# Patient Record
Sex: Male | Born: 1960 | Race: White | Hispanic: No | Marital: Single | Smoking: Current every day smoker
Health system: Southern US, Community
[De-identification: ages and names within clinical notes are randomized; demographics above are authoritative.]

## PROBLEM LIST (undated history)

## (undated) DIAGNOSIS — I480 Paroxysmal atrial fibrillation: Secondary | ICD-10-CM

## (undated) DIAGNOSIS — Z720 Tobacco use: Secondary | ICD-10-CM

## (undated) DIAGNOSIS — I2510 Atherosclerotic heart disease of native coronary artery without angina pectoris: Secondary | ICD-10-CM

## (undated) DIAGNOSIS — E669 Obesity, unspecified: Secondary | ICD-10-CM

## (undated) DIAGNOSIS — L03116 Cellulitis of left lower limb: Secondary | ICD-10-CM

## (undated) DIAGNOSIS — F32A Depression, unspecified: Secondary | ICD-10-CM

## (undated) DIAGNOSIS — Z8719 Personal history of other diseases of the digestive system: Secondary | ICD-10-CM

## (undated) DIAGNOSIS — L02416 Cutaneous abscess of left lower limb: Secondary | ICD-10-CM

## (undated) DIAGNOSIS — I252 Old myocardial infarction: Secondary | ICD-10-CM

## (undated) DIAGNOSIS — I10 Essential (primary) hypertension: Secondary | ICD-10-CM

## (undated) DIAGNOSIS — M7989 Other specified soft tissue disorders: Secondary | ICD-10-CM

## (undated) DIAGNOSIS — F419 Anxiety disorder, unspecified: Secondary | ICD-10-CM

## (undated) DIAGNOSIS — G4733 Obstructive sleep apnea (adult) (pediatric): Secondary | ICD-10-CM

## (undated) DIAGNOSIS — E785 Hyperlipidemia, unspecified: Secondary | ICD-10-CM

## (undated) DIAGNOSIS — F329 Major depressive disorder, single episode, unspecified: Secondary | ICD-10-CM

## (undated) HISTORY — PX: ANKLE SURGERY: SHX546

## (undated) HISTORY — DX: Paroxysmal atrial fibrillation: I48.0

## (undated) HISTORY — DX: Tobacco use: Z72.0

## (undated) HISTORY — DX: Hyperlipidemia, unspecified: E78.5

## (undated) HISTORY — DX: Atherosclerotic heart disease of native coronary artery without angina pectoris: I25.10

## (undated) HISTORY — DX: Major depressive disorder, single episode, unspecified: F32.9

## (undated) HISTORY — DX: Obstructive sleep apnea (adult) (pediatric): G47.33

## (undated) HISTORY — DX: Personal history of other diseases of the digestive system: Z87.19

## (undated) HISTORY — DX: Essential (primary) hypertension: I10

## (undated) HISTORY — DX: Cellulitis of left lower limb: L03.116

## (undated) HISTORY — DX: Old myocardial infarction: I25.2

## (undated) HISTORY — DX: Depression, unspecified: F32.A

## (undated) HISTORY — DX: Other specified soft tissue disorders: M79.89

## (undated) HISTORY — DX: Cutaneous abscess of left lower limb: L02.416

## (undated) HISTORY — DX: Anxiety disorder, unspecified: F41.9

## (undated) HISTORY — DX: Obesity, unspecified: E66.9

---

## 2004-10-17 ENCOUNTER — Emergency Department: Payer: Self-pay | Admitting: Unknown Physician Specialty

## 2007-02-12 DIAGNOSIS — I2510 Atherosclerotic heart disease of native coronary artery without angina pectoris: Secondary | ICD-10-CM

## 2007-02-12 HISTORY — DX: Atherosclerotic heart disease of native coronary artery without angina pectoris: I25.10

## 2007-02-12 HISTORY — PX: CARDIAC CATHETERIZATION: SHX172

## 2007-11-04 ENCOUNTER — Other Ambulatory Visit: Payer: Self-pay

## 2007-11-05 ENCOUNTER — Inpatient Hospital Stay: Payer: Self-pay | Admitting: Internal Medicine

## 2007-11-05 ENCOUNTER — Ambulatory Visit: Payer: Self-pay | Admitting: Cardiology

## 2007-11-18 ENCOUNTER — Ambulatory Visit: Payer: Self-pay | Admitting: Cardiovascular Disease

## 2008-05-23 ENCOUNTER — Ambulatory Visit: Payer: Self-pay | Admitting: Internal Medicine

## 2008-05-23 ENCOUNTER — Encounter: Payer: Self-pay | Admitting: Cardiovascular Disease

## 2008-05-23 LAB — CONVERTED CEMR LAB
Total CHOL/HDL Ratio: 6.5
Total CK: 88 units/L (ref 7–232)
VLDL: 33 mg/dL (ref 0–40)

## 2008-05-31 ENCOUNTER — Ambulatory Visit: Payer: Self-pay | Admitting: Cardiovascular Disease

## 2008-07-05 ENCOUNTER — Telehealth: Payer: Self-pay | Admitting: Cardiovascular Disease

## 2008-07-06 ENCOUNTER — Observation Stay: Payer: Self-pay | Admitting: Internal Medicine

## 2008-07-06 ENCOUNTER — Ambulatory Visit: Payer: Self-pay | Admitting: Internal Medicine

## 2008-07-08 ENCOUNTER — Telehealth: Payer: Self-pay | Admitting: Cardiovascular Disease

## 2009-01-29 ENCOUNTER — Emergency Department: Payer: Self-pay | Admitting: Emergency Medicine

## 2010-06-26 NOTE — Assessment & Plan Note (Signed)
[facility] OFFICE NOTE   Jeffrey Alvarado, Jeffrey Alvarado                   MRN:          [id number]  DATE:11/18/2007                            DOB:          [birth year]    HISTORY OF PRESENT ILLNESS:  Jeffrey Alvarado is a pleasant 50 year old  Caucasian male with a past medical history significant for hypertension,  depression, tobacco, abuse, positive HIV by ELISA who was recently  admitted to the [facility] with complaints of  chest pain and was found to have a non-ST-elevation myocardial  infarction.  I performed a left heart catheterization on the patient on  November 06, 2007 and found that he had a 95% lesion in the proximal  right coronary artery.  There was nonobstructive disease in the LAD as  well as the circumflex.  I elected to perform a percutaneous coronary  intervention that day and placed a Vision bare-metal stent in the  proximal right coronary artery.  The patient was discharged to home on  aspirin, Plavix, beta-blocker, and a statin.  He has done well since his  discharge from the hospital on November 07, 2007.  He tells me that he  has had no recurrence of his chest pain, shortness of breath, or  palpitations.  He also denies any dizziness, near syncope, syncope,  diaphoresis, nausea, vomiting, orthopnea, PND, or lower extremity edema.  He states that his breathing is actually much better now than it has  been in several years.  His only complaint today is that he has a  swollen left jaw, which he feels is secondary to an infected tooth that  broke off recently.  He has not seen a dentist for this problem and  states it only started hurting this morning around 4:00 a.m.  He denies  any fevers or chills.   PAST MEDICAL HISTORY:  1. Coronary artery disease status post left heart catheterization on      November 06, 2007, which showed a 20% stenosis in the mid LAD, 25%  stenosis in the mid circumflex, 20% stenosis in the first obtuse      marginal,  95% stenosis in the proximal right coronary artery, 30%      stenosis in the mid right coronary artery, and 40% stenosis in the      distal right coronary artery.  The 95% stenosis in the right      coronary artery was addressed that day with placement of a Vision      bare-metal stent.  2. Hypertension.  3. Depression and panic attacks.  4. Tobacco abuse.  5. HIV positive by ELISA.   PAST SURGICAL HISTORY:  1. Ankle surgery secondary to a fracture from a trauma.   ALLERGIES:  CODEINE.   CURRENT MEDICATIONS:  1. Zoloft 50 mg once daily.  2. Aspirin 325 mg once daily.  3. Plavix 75 mg once daily.  4. Metoprolol 25 mg twice daily.  5. Lipitor 40 mg once daily.  6. Hydrochlorothiazide 25 mg once daily.   SOCIAL HISTORY:  He has smoked 1-2 packs per day for  the last 20 years.  He currently is smoking a half-pack of cigarettes per day.  He only  occasionally uses alcohol.  He denies use of illicit drugs.  He is  unemployed.   FAMILY HISTORY:  The patient's grandmother had atrial fibrillation and  stroke.  There is no family history of premature coronary artery  disease.  His parents are healthy.   REVIEW OF SYSTEMS:  As stated in the history of present illness, and is  otherwise negative.   PHYSICAL EXAMINATION:  GENERAL:  He is a pleasant young Caucasian male  in no acute distress.  VITAL SIGNS:  Blood pressure 120/78, pulse 81 and regular, and  respirations 12 and unlabored.  Weight 235 pounds.  NECK:  No JVD.  No carotid bruits.  No thyromegaly.  No lymphadenopathy.  SKIN:  Warm and dry.  HEENT:  Oropharynx clear.  Mucous membranes are moist.  I am unable to  visualize the broken tooth.  I did palpate the patient's jaw and there  is local swelling underneath his left mandible as well as over the  surface of his left cheek.  The patient has full range of motion of his  jaw at the current  time.  LUNGS:  Clear to auscultation bilaterally without wheezes, rhonchi, or  crackles.  CARDIOVASCULAR:  Regular rate and rhythm without murmurs, gallops, or  rubs noted.  ABDOMEN:  Soft, nontender.  Bowel sounds are present.  EXTREMITIES:  No evidence of edema.  Pulses are 2+ in all extremities.   DIAGNOSTIC STUDIES:  1. A 12-lead electrocardiogram obtained in our office today shows      normal sinus rhythm with a ventricular rate of 81 beats per minute.      There are no ischemic changes noted.  2. Left heart catheterization as outlined above performed on November 06, 2007.  As documented, Vision bare-mental stent was placed in      the 95% stenosis in the proximal right coronary artery.   ASSESSMENT AND PLAN:  This is a pleasant 50 year old Caucasian male with  coronary artery disease, hypertension, tobacco abuse and human  immunodeficiency syndrome who presents to our hospital followup after  placement of a bare-metal stent in his proximal right coronary artery.  From a cardiovascular standpoint, the patient is doing very well.  I  would like to continue his aspirin and Plavix for at least 4 weeks if  possible.  He has made arrangements through his primary care office to  pay for all of his medications.  I am somewhat concerned about his left  facial swelling, which is most likely secondary to an inflamed or  infected molar.  I have encouraged him to get in and see a dentist some  time in the next day or 2.  The patient has been taking what sounds like  amoxicillin at home, which he had from a prior dental infection.  I am  most concerned about him being continued on his aspirin and Plavix.  I  have told him that if his dentist would like to pull his tooth that I  would not like for the Plavix to be stopped.  I would at least like to  receive a phone call from the patient or his dentist prior to tooth  extraction.  It would be best if we could wait another 2 weeks to   perform any tooth extraction, because of the patient's Plavix.  With a  bare-metal stent, we  would like to have at least 4 weeks of total Plavix  coverage prior to discontinuing this therapy.  In regards to his other  medications, I would like to continue the metoprolol and Lipitor as  written.  I will see him back in our office here in 6 months and will  perform a fasting lipid profile that day of his visit in this office.     Verne Carrow, MD  Electronically Signed    CM/MedQ  DD: 11/18/2007  DT: 11/19/2007  Job #: [id number]   cc:   Pilar Grammes

## 2010-06-26 NOTE — Assessment & Plan Note (Signed)
[facility] OFFICE NOTE   HAGER, COMPSTON                   MRN:          [id number]  DATE:05/31/2008                            DOB:          1960-06-17    PRIMARY CARE PHYSICIAN:  Dr. Brain Hilts at [facility].   HISTORY OF PRESENT ILLNESS:  Jeffrey Alvarado is a pleasant 50 year old  Caucasian male with a past medical history significant for hypertension,  coronary artery disease, hyperlipidemia, depression and tobacco abuse,  who was followed in our office for his coronary artery disease and  returns today for routine cardiac followup.  He had a non-ST-elevation  myocardial infarction in September 2009 and was found during diagnostic  left heart catheterization to have a 95% lesion in the proximal right  coronary artery.  There was nonobstructive disease noted in the LAD, as  well as the circumflex coronary artery.  I performed a percutaneous  coronary intervention with placement of a Vision bare-metal stent in the  proximal right coronary artery at that time.  He has been seen once  since his hospital discharge in our office in October 2009.  He returns  today and tells me that he has been doing well.  It was documented while  he was in the hospital that he had a positive ELISA for HIV.  Since  then, his primary care physician has rechecked his HIV status and has  told him that it is negative.  He has no cardiac complaints at the  current time.  He denies any exertional chest pain, exertional dyspnea,  palpitations, dizziness, near-syncope, syncope, diaphoresis, orthopnea,  PND, or lower extremity edema.  He has been taking his aspirin, Plavix  beta-blocker, and hydrochlorothiazide as prescribed.  He has  unfortunately not been taking a statin medication.  We had him come in  for a fasting lipid profile in our office on May 23, 2008, that showed  his total cholesterol of 240, triglycerides  163, HDL cholesterol 37, and  LDL cholesterol was 170.  These values were obtained when he was not  taking his Lipitor as prescribed.  I asked him to start taking  pravastatin 80 mg once daily, which is on the $4 list at [facility].  He  started taking that medication last week.  He unfortunately has gained  36 pounds since his last visit in our office 6 months ago.  He does  continue to smoke cigarettes.   PAST MEDICAL HISTORY:  1. Coronary artery disease status post placement of a Vision bare-      metal stent in the proximal right coronary artery in September      2009.  Mild nonobstructive disease in the LAD with a 20% stenosis      in the midportion of LAD, 25% stenosis in the mid circumflex, and      20% stenosis in the mid right coronary artery with a 40% stenosis      in the distal right coronary artery.  2. Hypertension.  3. Hyperlipidemia.  4. Depression and panic attacks.  5. Tobacco abuse.  6. Unknown  HIV status.  The patient tested positive while in the      hospital, but has, per his report, tested negative in his primary      care office.   ALLERGIES:  CODEINE.   CURRENT MEDICATIONS:  1. Zoloft 50 mg once daily.  2. Aspirin 325 mg once daily.  3. Plavix 75 mg once daily.  4. Metoprolol 25 mg twice daily.  5. Lipitor.  6. Hydrochlorothiazide 25 mg once daily.  7. Pravastatin 80 mg once daily.   PHYSICAL EXAMINATION:  VITAL SIGNS:  Blood pressure 114/80, pulse 59 and  regular, and respirations 12 and unlabored.  GENERAL:  He is a pleasant young Caucasian male in no acute distress.  He is alert and oriented x3.  HEENT:  Normal.  SKIN:  Warm and dry.  NECK:  No JVD.  No carotid bruits.  No thyromegaly.  No lymphadenopathy.  LUNGS:  Clear to auscultation bilaterally without wheezes, rhonchi, or  crackles noted.  CARDIOVASCULAR:  Bradycardia with normal first and second heart sounds.  No murmurs, gallops, or rubs are noted.  ABDOMEN:  Obese, soft, bowel  sounds are  present.  EXTREMITIES:  No evidence of edema.  Pulses are 2+ in all extremities.  PSYCHIATRIC:  Mood and affect are appropriate.  MUSCULOSKELETAL:  Muscle strength is normal in all extremities.   DIAGNOSTIC STUDIES:  1. A 12-lead EKG obtained in our office today shows sinus bradycardia      with a ventricular rate of 59 beats per minute.  This is otherwise      a normal EKG.  2. Recent fasting lipid profile from May 23, 2008, shows total      cholesterol of 240, triglycerides 163, HDL cholesterol 37, LDL      cholesterol 170, and CK 88.   ASSESSMENT AND PLAN:  This is a pleasant 50 year old Caucasian male with  a history of coronary artery disease, hypertension, hyperlipidemia and  tobacco abuse, who presents for a routine cardiac followup.  From a  cardiovascular standpoint, he is doing very well.  I would like to  reduce his dose of aspirin to 81 mg once daily.  He will continue his  Plavix to complete a 1-year course.  If for any reason, he were to need  to have a surgical procedure within the next 6 months, I think we could  safely stop his Plavix.  I am concerned that he stopped taking his  statin medication.  We have recently resumed him on a more cost  effective regimen of pravastatin 80 mg once daily.  I would like to have  him come back in 12 weeks to have a fasting lipid profile.  We will also  check liver function tests and a CK level at that time.  In regards to  his blood pressure, it is under very good control.  I would like to see  him back in my office in 6 months.  Once again, he was instructed that he should cease from using tobacco.  The patient understands the tobacco use could be detrimental to his  health.      Verne Carrow, MD  Electronically Signed    CM/MedQ  DD: 05/31/2008  DT: 06/01/2008  Job #: [id number]   cc:   Brain Hilts, MD

## 2010-07-31 ENCOUNTER — Encounter: Payer: Self-pay | Admitting: Cardiovascular Disease

## 2010-08-24 ENCOUNTER — Encounter: Payer: Self-pay | Admitting: Cardiovascular Disease

## 2010-12-01 ENCOUNTER — Inpatient Hospital Stay: Payer: Self-pay | Admitting: Internal Medicine

## 2010-12-01 DIAGNOSIS — I200 Unstable angina: Secondary | ICD-10-CM

## 2010-12-01 DIAGNOSIS — R079 Chest pain, unspecified: Secondary | ICD-10-CM

## 2010-12-03 ENCOUNTER — Encounter: Payer: Self-pay | Admitting: Cardiovascular Disease

## 2010-12-03 DIAGNOSIS — I2510 Atherosclerotic heart disease of native coronary artery without angina pectoris: Secondary | ICD-10-CM

## 2010-12-20 ENCOUNTER — Encounter: Payer: Self-pay | Admitting: Cardiovascular Disease

## 2010-12-24 ENCOUNTER — Encounter: Payer: Self-pay | Admitting: Cardiovascular Disease

## 2011-04-03 ENCOUNTER — Encounter: Payer: Self-pay | Admitting: *Deleted

## 2014-04-07 ENCOUNTER — Emergency Department: Payer: Self-pay | Admitting: Emergency Medicine

## 2014-04-13 DIAGNOSIS — I10 Essential (primary) hypertension: Secondary | ICD-10-CM

## 2014-04-13 DIAGNOSIS — L03116 Cellulitis of left lower limb: Secondary | ICD-10-CM | POA: Insufficient documentation

## 2014-04-13 DIAGNOSIS — Z720 Tobacco use: Secondary | ICD-10-CM | POA: Insufficient documentation

## 2014-04-13 DIAGNOSIS — I2510 Atherosclerotic heart disease of native coronary artery without angina pectoris: Secondary | ICD-10-CM

## 2014-04-13 DIAGNOSIS — F419 Anxiety disorder, unspecified: Secondary | ICD-10-CM | POA: Insufficient documentation

## 2014-04-13 HISTORY — DX: Cellulitis of left lower limb: L03.116

## 2014-04-13 HISTORY — DX: Anxiety disorder, unspecified: F41.9

## 2014-04-13 HISTORY — DX: Essential (primary) hypertension: I10

## 2014-04-13 HISTORY — DX: Atherosclerotic heart disease of native coronary artery without angina pectoris: I25.10

## 2014-04-13 HISTORY — DX: Tobacco use: Z72.0

## 2015-06-07 ENCOUNTER — Emergency Department
Admission: EM | Admit: 2015-06-07 | Discharge: 2015-06-07 | Disposition: A | Discharge status: Home / Self Care | Payer: 59 | Attending: Emergency Medicine | Admitting: Emergency Medicine

## 2015-06-07 ENCOUNTER — Emergency Department: Payer: 59

## 2015-06-07 ENCOUNTER — Encounter: Payer: Self-pay | Admitting: Emergency Medicine

## 2015-06-07 DIAGNOSIS — Z955 Presence of coronary angioplasty implant and graft: Secondary | ICD-10-CM | POA: Diagnosis not present

## 2015-06-07 DIAGNOSIS — W010XXA Fall on same level from slipping, tripping and stumbling without subsequent striking against object, initial encounter: Secondary | ICD-10-CM | POA: Diagnosis not present

## 2015-06-07 DIAGNOSIS — Y999 Unspecified external cause status: Secondary | ICD-10-CM | POA: Insufficient documentation

## 2015-06-07 DIAGNOSIS — Y929 Unspecified place or not applicable: Secondary | ICD-10-CM | POA: Diagnosis not present

## 2015-06-07 DIAGNOSIS — S82832A Other fracture of upper and lower end of left fibula, initial encounter for closed fracture: Secondary | ICD-10-CM | POA: Diagnosis not present

## 2015-06-07 DIAGNOSIS — Z79899 Other long term (current) drug therapy: Secondary | ICD-10-CM | POA: Diagnosis not present

## 2015-06-07 DIAGNOSIS — F329 Major depressive disorder, single episode, unspecified: Secondary | ICD-10-CM | POA: Diagnosis not present

## 2015-06-07 DIAGNOSIS — Z21 Asymptomatic human immunodeficiency virus [HIV] infection status: Secondary | ICD-10-CM | POA: Insufficient documentation

## 2015-06-07 DIAGNOSIS — Z7982 Long term (current) use of aspirin: Secondary | ICD-10-CM | POA: Insufficient documentation

## 2015-06-07 DIAGNOSIS — F17200 Nicotine dependence, unspecified, uncomplicated: Secondary | ICD-10-CM | POA: Insufficient documentation

## 2015-06-07 DIAGNOSIS — I10 Essential (primary) hypertension: Secondary | ICD-10-CM | POA: Diagnosis not present

## 2015-06-07 DIAGNOSIS — M79605 Pain in left leg: Secondary | ICD-10-CM | POA: Diagnosis present

## 2015-06-07 DIAGNOSIS — Y939 Activity, unspecified: Secondary | ICD-10-CM | POA: Insufficient documentation

## 2015-06-07 DIAGNOSIS — E669 Obesity, unspecified: Secondary | ICD-10-CM | POA: Insufficient documentation

## 2015-06-07 DIAGNOSIS — S82839A Other fracture of upper and lower end of unspecified fibula, initial encounter for closed fracture: Secondary | ICD-10-CM

## 2015-06-07 MED ORDER — HYDROCODONE-ACETAMINOPHEN 5-325 MG PO TABS: 1.0000 | ORAL_TABLET | ORAL | Status: DC | PRN

## 2015-06-07 NOTE — ED Notes (Signed)
Patient to ER for c/o left leg/left knee pain after fall last Sunday. [place]. Patient ambulatory to triage, but with limp. States greatest pain is on lateral side of left knee.

## 2015-06-07 NOTE — ED Provider Notes (Signed)
[facility] Emergency Department Provider Note ____________________________________________  Time seen: Approximately 11:10 AM  I have reviewed the triage vital signs and the nursing notes.   HISTORY  Chief Complaint Leg Pain    HPI Jeffrey LangeDanny R Alvarado is a 55 y.o. male who presents to the emergency department for evaluation of left knee pain. [place] and landed with his left knee bent behind him. He  Has had pain and swelling in the knee area since. Pain is worse on the medial aspect. He has been taking tylenol with little relief. He has no history of fracture to the same area.  Past Medical History  Diagnosis Date  . Depression   . Hypertension   . Obstructive sleep apnea   . Coronary artery disease 2009    s/p stent to RCA  . History of myocardial infarction   . Obesity   . Dyslipidemia   . HIV positive (HCC)   . History of rectal bleeding   . Obesity     There are no active problems to display for this patient.   Past Surgical History  Procedure Laterality Date  . Cardiac catheterization  2009    s/p right coronary stent   . Ankle surgery Left     Current Outpatient Rx  Name  Route  Sig  Dispense  Refill  . citalopram (CELEXA) 40 MG tablet   Oral   Take 40 mg by mouth daily.         Marland Kitchen. aspirin 325 MG tablet   Oral   Take 325 mg by mouth daily.           Marland Kitchen. buPROPion (WELLBUTRIN) 100 MG tablet   Oral   Take 100 mg by mouth 2 (two) times daily.           Marland Kitchen. HYDROcodone-acetaminophen (NORCO/VICODIN) 5-325 MG tablet   Oral   Take 1 tablet by mouth every 4 (four) hours as needed for moderate pain.   12 tablet   0   . lisinopril (PRINIVIL,ZESTRIL) 5 MG tablet   Oral   Take 5 mg by mouth daily.           Marland Kitchen. lovastatin (MEVACOR) 20 MG tablet   Oral   Take 20 mg by mouth at bedtime.           . metoprolol tartrate (LOPRESSOR) 25 MG tablet   Oral   Take 25 mg by  mouth 2 (two) times daily.           . nitroGLYCERIN (NITROSTAT) 0.4 MG SL tablet   Sublingual   Place 0.4 mg under the tongue every 5 (five) minutes as needed.           . pravastatin (PRAVACHOL) 80 MG tablet   Oral   Take 80 mg by mouth at bedtime.           . sertraline (ZOLOFT) 100 MG tablet   Oral   Take 100 mg by mouth daily.             Allergies Review of patient's allergies indicates no known allergies.  Family History  Problem Relation Age of Onset  . Atrial fibrillation      FH  . Other Daughter     liver transplant    Social History Social History  Substance Use Topics  . Smoking status: Current Every Day Smoker  . Smokeless tobacco: Never Used  .  Alcohol Use: No    Review of Systems Constitutional: No recent illness. Musculoskeletal: Pain in left lower extremity Skin: Negative for rash or wound. Neurological: Negative for headaches, focal weakness or numbness. ____________________________________________   PHYSICAL EXAM:  VITAL SIGNS: ED Triage Vitals  Enc Vitals Group     BP 06/07/15 1035 158/97 mmHg     Pulse Rate 06/07/15 1035 56     Resp 06/07/15 1035 20     Temp 06/07/15 1035 98 F (36.7 C)     Temp Source 06/07/15 1035 Oral     SpO2 06/07/15 1035 96 %     Weight 06/07/15 1035 268 lb (121.564 kg)     Height 06/07/15 1035  (1.803 m)     Head Cir --      Peak Flow --      Pain Score 06/07/15 1035 8     Pain Loc --      Pain Edu? --      Excl. in GC? --     Constitutional: Alert and oriented. Well appearing and in no acute distress. Eyes: Conjunctivae are normal. EOMI. Musculoskeletal: Tenderness on the medial aspect of the left knee. Left knee is mildly swollen and a joint effusion is evident.  Neurologic:  Normal speech and language. No gross focal neurologic deficits are appreciated. Speech is normal. No gait instability. Skin:  Skin is warm, dry and intact. Atraumatic. Psychiatric: Mood and affect are normal.  Speech and behavior are normal.  ____________________________________________   LABS (all labs ordered are listed, but only abnormal results are displayed)  Labs Reviewed - No data to display ____________________________________________  RADIOLOGY  Mildly displaced fracture of the proximal left fibula.  I, Kem Boroughs, personally viewed and evaluated these images (plain radiographs) as part of my medical decision making, as well as reviewing the written report by the radiologist.  ____________________________________________   PROCEDURES  Procedure(s) performed:   Left knee placed in a knee immobilizer by ER tech.  __________________________________________   INITIAL IMPRESSION / ASSESSMENT AND PLAN / ED COURSE  Pertinent labs & imaging results that were available during my care of the patient were reviewed by me and considered in my medical decision making (see chart for details).  Patient to call and schedule a follow up with orthopedics. He will wear the knee immobilizer and remain non weight bearing until follow up. He is to return to the ER for symptoms of concern if unable to schedule an appointment with the specialist. ____________________________________________   FINAL CLINICAL IMPRESSION(S) / ED DIAGNOSES  Final diagnoses:  Fibula upper end fracture       Chinita Pester, FNP 06/07/15 1606  Arnaldo Natal, MD 06/08/15 2200

## 2015-06-07 NOTE — ED Notes (Signed)
See triage  States he fell last Sunday  Landed on knee   Having pain to left knee  Min swelling noted .Marland Kitchen. Ambulates with sl limp d/t pain

## 2016-01-17 ENCOUNTER — Emergency Department: Payer: 59

## 2016-01-17 ENCOUNTER — Emergency Department
Admission: EM | Admit: 2016-01-17 | Discharge: 2016-01-17 | Disposition: A | Discharge status: Home / Self Care | Payer: 59 | Attending: Emergency Medicine | Admitting: Emergency Medicine

## 2016-01-17 ENCOUNTER — Encounter: Payer: Self-pay | Admitting: Emergency Medicine

## 2016-01-17 DIAGNOSIS — I10 Essential (primary) hypertension: Secondary | ICD-10-CM | POA: Diagnosis not present

## 2016-01-17 DIAGNOSIS — Z7982 Long term (current) use of aspirin: Secondary | ICD-10-CM | POA: Diagnosis not present

## 2016-01-17 DIAGNOSIS — R05 Cough: Secondary | ICD-10-CM | POA: Diagnosis present

## 2016-01-17 DIAGNOSIS — J209 Acute bronchitis, unspecified: Secondary | ICD-10-CM

## 2016-01-17 DIAGNOSIS — Z79899 Other long term (current) drug therapy: Secondary | ICD-10-CM | POA: Insufficient documentation

## 2016-01-17 DIAGNOSIS — I2510 Atherosclerotic heart disease of native coronary artery without angina pectoris: Secondary | ICD-10-CM | POA: Diagnosis not present

## 2016-01-17 DIAGNOSIS — F17200 Nicotine dependence, unspecified, uncomplicated: Secondary | ICD-10-CM | POA: Insufficient documentation

## 2016-01-17 MED ORDER — IPRATROPIUM-ALBUTEROL 0.5-2.5 (3) MG/3ML IN SOLN
3.0000 mL | Freq: Once | RESPIRATORY_TRACT | Status: AC
  Administered 2016-01-17: 3 mL via RESPIRATORY_TRACT
  Filled 2016-01-17: qty 3

## 2016-01-17 MED ORDER — PREDNISONE 10 MG PO TABS: 50.0000 mg | ORAL_TABLET | Freq: Every day | ORAL | 0 refills | Status: DC

## 2016-01-17 MED ORDER — ALBUTEROL SULFATE HFA 108 (90 BASE) MCG/ACT IN AERS: 2.0000 | INHALATION_SPRAY | RESPIRATORY_TRACT | 1 refills | Status: DC | PRN

## 2016-01-17 MED ORDER — AZITHROMYCIN 250 MG PO TABS: ORAL_TABLET | ORAL | 0 refills | Status: DC

## 2016-01-17 NOTE — ED Provider Notes (Signed)
[facility] Emergency Department Provider Note  ____________________________________________  Time seen: Approximately 2:19 PM  I have reviewed the triage vital signs and the nursing notes.   HISTORY  Chief Complaint Cough   HPI Jeffrey Alvarado is a 55 y.o. male who presents to the emergency department for evaluation of cough and body aches for the past 2 weeks. He states that his symptoms initially started as a common cold and the sore throat and earache has subsided, however the cough continues. He states that he has been taken over the counter medications without relief.   Past Medical History:  Diagnosis Date  . Coronary artery disease 2009   s/p stent to RCA  . Depression   . Dyslipidemia   . History of myocardial infarction   . History of rectal bleeding   . Hypertension   . Obesity   . Obesity   . Obstructive sleep apnea     There are no active problems to display for this patient.   Past Surgical History:  Procedure Laterality Date  . ANKLE SURGERY Left   . CARDIAC CATHETERIZATION  2009   s/p right coronary stent     Prior to Admission medications   Medication Sig Start Date End Date Taking? Authorizing Provider  albuterol (PROVENTIL HFA;VENTOLIN HFA) 108 (90 Base) MCG/ACT inhaler Inhale 2 puffs into the lungs every 4 (four) hours as needed for wheezing or shortness of breath. 01/17/16   Chinita Pesterari B Verneda Hollopeter, FNP  aspirin 325 MG tablet Take 325 mg by mouth daily.      Historical Provider, MD  azithromycin (ZITHROMAX Z-PAK) 250 MG tablet Take 2 tablets (500 mg) on  Day 1,  followed by 1 tablet (250 mg) once daily on Days 2 through 5. 01/17/16   Elmo Rio B Marina Boerner, FNP  buPROPion (WELLBUTRIN) 100 MG tablet Take 100 mg by mouth 2 (two) times daily.      Historical Provider, MD  citalopram (CELEXA) 40 MG tablet Take 40 mg by mouth daily.    Historical Provider, MD  HYDROcodone-acetaminophen (NORCO/VICODIN) 5-325 MG tablet Take 1 tablet by mouth  every 4 (four) hours as needed for moderate pain. 06/07/15   Chinita Pesterari B Larry Knipp, FNP  lisinopril (PRINIVIL,ZESTRIL) 5 MG tablet Take 5 mg by mouth daily.      Historical Provider, MD  lovastatin (MEVACOR) 20 MG tablet Take 20 mg by mouth at bedtime.      Historical Provider, MD  metoprolol tartrate (LOPRESSOR) 25 MG tablet Take 25 mg by mouth 2 (two) times daily.      Historical Provider, MD  nitroGLYCERIN (NITROSTAT) 0.4 MG SL tablet Place 0.4 mg under the tongue every 5 (five) minutes as needed.      Historical Provider, MD  pravastatin (PRAVACHOL) 80 MG tablet Take 80 mg by mouth at bedtime.      Historical Provider, MD  predniSONE (DELTASONE) 10 MG tablet Take 5 tablets (50 mg total) by mouth daily. 01/17/16   Chinita Pesterari B Amarian Botero, FNP  sertraline (ZOLOFT) 100 MG tablet Take 100 mg by mouth daily.      Historical Provider, MD    Allergies Patient has no known allergies.  Family History  Problem Relation Age of Onset  . Atrial fibrillation      FH  . Other Daughter     liver transplant    Social History Social History  Substance Use Topics  . Smoking status: Current Every Day Smoker  . Smokeless tobacco: Never Used  . Alcohol  use No    Review of Systems Constitutional: Negative fever/chills ENT: Negative for sore throat. Cardiovascular: Denies chest pain. Respiratory: Occasional shortness of breath. Negative for cough. Gastrointestinal: Negative for nausea,  no vomiting.  Negative for diarrhea.  Musculoskeletal: Negative for body aches Skin: Negative for rash. Neurological: Negative for headaches ____________________________________________   PHYSICAL EXAM:  VITAL SIGNS: ED Triage Vitals [01/17/16 1338]  Enc Vitals Group     BP (!) 139/94     Pulse Rate 65     Resp 20     Temp 97.9 F (36.6 C)     Temp Source Oral     SpO2 98 %     Weight 268 lb (121.6 kg)     Height      Head Circumference      Peak Flow      Pain Score 6     Pain Loc      Pain Edu?      Excl. in  GC?     Constitutional: Alert and oriented. Well appearing and in no acute distress. Eyes: Conjunctivae are normal. EOMI. Ears: Bilateral tympanic membranes are normal Nose: Maxillary sinus congestion; no rhinnorhea. Mouth/Throat: Mucous membranes are moist.  Oropharynx mildly erythematous. Tonsils appear without exudate. Neck: No stridor.  Lymphatic: Anterior cervical lymphadenopathy bilaterally. Cardiovascular: Normal rate, regular rhythm. Grossly normal heart sounds.  Good peripheral circulation. Respiratory: Normal respiratory effort.  No retractions. Faint expiratory wheezes noted in the bilateral bases without rales or rhonchi. Gastrointestinal: Soft and nontender.  Musculoskeletal: FROM x 4 extremities.  Neurologic:  Normal speech and language.  Skin:  Skin is warm, dry and intact. No rash noted. Psychiatric: Mood and affect are normal. Speech and behavior are normal.  ____________________________________________   LABS (all labs ordered are listed, but only abnormal results are displayed)  Labs Reviewed - No data to display ____________________________________________  EKG   ____________________________________________  RADIOLOGY  Chest x-ray negative for acute cardiopulmonary abnormality per radiology ____________________________________________   PROCEDURES  Procedure(s) performed: None  Critical Care performed: No  ____________________________________________   INITIAL IMPRESSION / ASSESSMENT AND PLAN / ED COURSE  Clinical Course     Pertinent labs & imaging results that were available during my care of the patient were reviewed by me and considered in my medical decision making (see chart for details).   55 year old male presenting with the 2 week history of cough, congestion, sore throat, and body aches. He smokes about one pack of cigarettes per day. While in the emergency department, he was given a DuoNeb treatment with relief of the expiratory  wheezes. He will be given prescriptions for azithromycin, prednisone, and albuterol today for treatment of bronchitis. He was instructed to follow-up with the primary care provider of his choice for symptoms that are not improving over the week. He was instructed to return to the emergency department for symptoms that change or worsen if he is unable schedule an appointment. ____________________________________________   FINAL CLINICAL IMPRESSION(S) / ED DIAGNOSES  Final diagnoses:  Acute bronchitis, unspecified organism    Note:  This document was prepared using Dragon voice recognition software and may include unintentional dictation errors.     Chinita PesterCari B Ivori Storr, FNP 01/19/16 1537    Jeanmarie PlantJames A McShane, MD 01/20/16 1114

## 2016-01-17 NOTE — ED Triage Notes (Signed)
Pt to ed with c/o cough, congestion, sore throat, body aches x 2 weeks.  Pt states now pain with deep breath and cough.  Also states cold air makes coughing worse.

## 2016-01-17 NOTE — Discharge Instructions (Signed)
Follow up with your PCP for symptoms that are not improving over the week. °Return to the ER for symptoms that change or worsen if unable to schedule an appointment. °

## 2016-05-09 ENCOUNTER — Emergency Department
Admission: EM | Admit: 2016-05-09 | Discharge: 2016-05-09 | Disposition: A | Discharge status: Home / Self Care | Payer: Self-pay | Attending: Emergency Medicine | Admitting: Emergency Medicine

## 2016-05-09 ENCOUNTER — Encounter: Payer: Self-pay | Admitting: Emergency Medicine

## 2016-05-09 ENCOUNTER — Emergency Department: Payer: Self-pay

## 2016-05-09 DIAGNOSIS — Y929 Unspecified place or not applicable: Secondary | ICD-10-CM | POA: Insufficient documentation

## 2016-05-09 DIAGNOSIS — Z951 Presence of aortocoronary bypass graft: Secondary | ICD-10-CM | POA: Insufficient documentation

## 2016-05-09 DIAGNOSIS — S8012XA Contusion of left lower leg, initial encounter: Secondary | ICD-10-CM | POA: Insufficient documentation

## 2016-05-09 DIAGNOSIS — I2510 Atherosclerotic heart disease of native coronary artery without angina pectoris: Secondary | ICD-10-CM | POA: Insufficient documentation

## 2016-05-09 DIAGNOSIS — Y9389 Activity, other specified: Secondary | ICD-10-CM | POA: Insufficient documentation

## 2016-05-09 DIAGNOSIS — I252 Old myocardial infarction: Secondary | ICD-10-CM | POA: Insufficient documentation

## 2016-05-09 DIAGNOSIS — Y999 Unspecified external cause status: Secondary | ICD-10-CM | POA: Insufficient documentation

## 2016-05-09 DIAGNOSIS — Z7982 Long term (current) use of aspirin: Secondary | ICD-10-CM | POA: Insufficient documentation

## 2016-05-09 DIAGNOSIS — Z79899 Other long term (current) drug therapy: Secondary | ICD-10-CM | POA: Insufficient documentation

## 2016-05-09 DIAGNOSIS — I10 Essential (primary) hypertension: Secondary | ICD-10-CM | POA: Insufficient documentation

## 2016-05-09 DIAGNOSIS — W010XXA Fall on same level from slipping, tripping and stumbling without subsequent striking against object, initial encounter: Secondary | ICD-10-CM | POA: Insufficient documentation

## 2016-05-09 DIAGNOSIS — L03116 Cellulitis of left lower limb: Secondary | ICD-10-CM | POA: Insufficient documentation

## 2016-05-09 DIAGNOSIS — F17200 Nicotine dependence, unspecified, uncomplicated: Secondary | ICD-10-CM | POA: Insufficient documentation

## 2016-05-09 MED ORDER — SULFAMETHOXAZOLE-TRIMETHOPRIM 800-160 MG PO TABS: 1.0000 | ORAL_TABLET | Freq: Two times a day (BID) | ORAL | 0 refills | Status: DC

## 2016-05-09 MED ORDER — TRAMADOL HCL 50 MG PO TABS: 50.0000 mg | ORAL_TABLET | Freq: Four times a day (QID) | ORAL | 0 refills | Status: DC | PRN

## 2016-05-09 NOTE — ED Notes (Signed)
See triage note  States he slipped on Sunday  Having pain to left foot/ankle and lower leg swelling noted to lower leg and ankle  Bruising noted across toes,lateral ankle

## 2016-05-09 NOTE — ED Triage Notes (Signed)
Pt presents to day with left ankle and lower leg pain from fall last weekend on the ice. Swelling an bruising noted to left ankle and lower  Leg.  Pt ambulated to triage with no difficulty noted.

## 2016-05-09 NOTE — ED Provider Notes (Signed)
[facility] Emergency Department Provider Note   ____________________________________________   First MD Initiated Contact with Patient 05/09/16 1139     (approximate)  I have reviewed the triage vital signs and the nursing notes.   HISTORY  Chief Complaint Ankle Pain    HPI Sallee LangeDanny R Brinegar is a 56 y.o. male patient complaining of left lower leg and ankle pain secondary to a slip and fall 6 days ago. She has noticed increased swelling and bruising to the left lower extremity. Patient rates pain as a 4/10. Patient is able to ambulate with no difficulty. Patient's concern secondary to left ankle surgery` 22 years ago.   Past Medical History:  Diagnosis Date  . Coronary artery disease 2009   s/p stent to RCA  . Depression   . Dyslipidemia   . History of myocardial infarction   . History of rectal bleeding   . Hypertension   . Obesity   . Obesity   . Obstructive sleep apnea     There are no active problems to display for this patient.   Past Surgical History:  Procedure Laterality Date  . ANKLE SURGERY Left   . CARDIAC CATHETERIZATION  2009   s/p right coronary stent     Prior to Admission medications   Medication Sig Start Date End Date Taking? Authorizing Provider  albuterol (PROVENTIL HFA;VENTOLIN HFA) 108 (90 Base) MCG/ACT inhaler Inhale 2 puffs into the lungs every 4 (four) hours as needed for wheezing or shortness of breath. 01/17/16   Chinita Pesterari B Triplett, FNP  aspirin 325 MG tablet Take 325 mg by mouth daily.      Historical Provider, MD  azithromycin (ZITHROMAX Z-PAK) 250 MG tablet Take 2 tablets (500 mg) on  Day 1,  followed by 1 tablet (250 mg) once daily on Days 2 through 5. 01/17/16   Cari B Triplett, FNP  buPROPion (WELLBUTRIN) 100 MG tablet Take 100 mg by mouth 2 (two) times daily.      Historical Provider, MD  citalopram (CELEXA) 40 MG tablet Take 40 mg by mouth daily.    Historical Provider, MD  HYDROcodone-acetaminophen  (NORCO/VICODIN) 5-325 MG tablet Take 1 tablet by mouth every 4 (four) hours as needed for moderate pain. 06/07/15   Chinita Pesterari B Triplett, FNP  lisinopril (PRINIVIL,ZESTRIL) 5 MG tablet Take 5 mg by mouth daily.      Historical Provider, MD  lovastatin (MEVACOR) 20 MG tablet Take 20 mg by mouth at bedtime.      Historical Provider, MD  metoprolol tartrate (LOPRESSOR) 25 MG tablet Take 25 mg by mouth 2 (two) times daily.      Historical Provider, MD  nitroGLYCERIN (NITROSTAT) 0.4 MG SL tablet Place 0.4 mg under the tongue every 5 (five) minutes as needed.      Historical Provider, MD  pravastatin (PRAVACHOL) 80 MG tablet Take 80 mg by mouth at bedtime.      Historical Provider, MD  predniSONE (DELTASONE) 10 MG tablet Take 5 tablets (50 mg total) by mouth daily. 01/17/16   Chinita Pesterari B Triplett, FNP  sertraline (ZOLOFT) 100 MG tablet Take 100 mg by mouth daily.      Historical Provider, MD  sulfamethoxazole-trimethoprim (BACTRIM DS,SEPTRA DS) 800-160 MG tablet Take 1 tablet by mouth 2 (two) times daily. 05/09/16   Joni Reiningonald K Shamanda Len, PA-C  traMADol (ULTRAM) 50 MG tablet Take 1 tablet (50 mg total) by mouth every 6 (six) hours as needed for moderate pain. 05/09/16   Joni Reiningonald K Chealsea Paske,  PA-C    Allergies Patient has no known allergies.  Family History  Problem Relation Age of Onset  . Atrial fibrillation      FH  . Other Daughter     liver transplant    Social History Social History  Substance Use Topics  . Smoking status: Current Every Day Smoker  . Smokeless tobacco: Never Used  . Alcohol use No    Review of Systems Constitutional: No fever/chills Eyes: No visual changes. ENT: No sore throat. Cardiovascular: Denies chest pain. Respiratory: Denies shortness of breath. Gastrointestinal: No abdominal pain.  No nausea, no vomiting.  No diarrhea.  No constipation. Genitourinary: Negative for dysuria. Musculoskeletal:Left lower extremity pain Skin: Negative for rash. Swelling and bruising. Neurological:  Negative for headaches, focal weakness or numbness. Psychiatric:Depression Endocrine:Hypertension hyperlipidemia ____________________________________________   PHYSICAL EXAM:  VITAL SIGNS: ED Triage Vitals [05/09/16 1125]  Enc Vitals Group     BP (!) 155/97     Pulse Rate 81     Resp 20     Temp 98.5 F (36.9 C)     Temp Source Oral     SpO2 99 %     Weight 268 lb (121.6 kg)     Height      Head Circumference      Peak Flow      Pain Score 4     Pain Loc      Pain Edu?      Excl. in GC?     Constitutional: Alert and oriented. Well appearing and in no acute distress. Eyes: Conjunctivae are normal. PERRL. EOMI. Head: Atraumatic. Nose: No congestion/rhinnorhea. Mouth/Throat: Mucous membranes are moist.  Oropharynx non-erythematous. Neck: No stridor.  No cervical spine tenderness to palpation. Hematological/Lymphatic/Immunilogical: No cervical lymphadenopathy. Cardiovascular: Normal rate, regular rhythm. Grossly normal heart sounds.  Good peripheral circulation.Elevated blood pressure Respiratory: Normal respiratory effort.  No retractions. Lungs CTAB. Gastrointestinal: Soft and nontender. No distention. No abdominal bruits. No CVA tenderness. Musculoskeletal: Edema and ecchymosis radiating from 2 dorsal aspect of left foot.  Neurologic:  Normal speech and language. No gross focal neurologic deficits are appreciated. No gait instability. Skin:  Skin is warm, dry and intact. No rash noted.Ecchymosis and erythema left lower lateral leg. Psychiatric: Mood and affect are normal. Speech and behavior are normal.  ____________________________________________   LABS (all labs ordered are listed, but only abnormal results are displayed)  Labs Reviewed - No data to display ____________________________________________  EKG   ____________________________________________  RADIOLOGY  No acute findings x-ray of the left lower  extremity. ____________________________________________   PROCEDURES  Procedure(s) performed: None  Procedures  Critical Care performed: No  ____________________________________________   INITIAL IMPRESSION / ASSESSMENT AND PLAN / ED COURSE  Pertinent labs & imaging results that were available during my care of the patient were reviewed by me and considered in my medical decision making (see chart for details).  Contusion left lower leg with mild cellulitis. Discussed x-ray finding with patient. Patient given discharge care instruction. Patient given a prescription for Bactrim DS and tramadol. Left leg was cleaned and bandaged prior to departure.      ____________________________________________   FINAL CLINICAL IMPRESSION(S) / ED DIAGNOSES  Final diagnoses:  Contusion of left leg, initial encounter  Cellulitis of left leg without foot      NEW MEDICATIONS STARTED DURING THIS VISIT:  New Prescriptions   SULFAMETHOXAZOLE-TRIMETHOPRIM (BACTRIM DS,SEPTRA DS) 800-160 MG TABLET    Take 1 tablet by mouth 2 (two) times daily.  TRAMADOL (ULTRAM) 50 MG TABLET    Take 1 tablet (50 mg total) by mouth every 6 (six) hours as needed for moderate pain.     Note:  This document was prepared using Dragon voice recognition software and may include unintentional dictation errors.    Joni Reining, PA-C 05/09/16 1239    Nita Sickle, MD 05/11/16 1140

## 2016-09-21 ENCOUNTER — Emergency Department: Payer: Self-pay

## 2016-09-21 ENCOUNTER — Inpatient Hospital Stay
Admission: EM | Admit: 2016-09-21 | Discharge: 2016-09-25 | DRG: 982 | Disposition: A | Discharge status: Home Health | Payer: Self-pay | Attending: Internal Medicine | Admitting: Internal Medicine

## 2016-09-21 ENCOUNTER — Encounter: Payer: Self-pay | Admitting: Emergency Medicine

## 2016-09-21 DIAGNOSIS — L03116 Cellulitis of left lower limb: Secondary | ICD-10-CM | POA: Diagnosis present

## 2016-09-21 DIAGNOSIS — I10 Essential (primary) hypertension: Secondary | ICD-10-CM | POA: Diagnosis present

## 2016-09-21 DIAGNOSIS — I2510 Atherosclerotic heart disease of native coronary artery without angina pectoris: Secondary | ICD-10-CM | POA: Diagnosis present

## 2016-09-21 DIAGNOSIS — I495 Sick sinus syndrome: Secondary | ICD-10-CM | POA: Diagnosis present

## 2016-09-21 DIAGNOSIS — M7989 Other specified soft tissue disorders: Secondary | ICD-10-CM

## 2016-09-21 DIAGNOSIS — F329 Major depressive disorder, single episode, unspecified: Secondary | ICD-10-CM | POA: Diagnosis present

## 2016-09-21 DIAGNOSIS — Z79899 Other long term (current) drug therapy: Secondary | ICD-10-CM

## 2016-09-21 DIAGNOSIS — Z6837 Body mass index (BMI) 37.0-37.9, adult: Secondary | ICD-10-CM

## 2016-09-21 DIAGNOSIS — Z7982 Long term (current) use of aspirin: Secondary | ICD-10-CM

## 2016-09-21 DIAGNOSIS — M726 Necrotizing fasciitis: Secondary | ICD-10-CM

## 2016-09-21 DIAGNOSIS — L02416 Cutaneous abscess of left lower limb: Secondary | ICD-10-CM | POA: Diagnosis present

## 2016-09-21 DIAGNOSIS — I252 Old myocardial infarction: Secondary | ICD-10-CM

## 2016-09-21 DIAGNOSIS — G4733 Obstructive sleep apnea (adult) (pediatric): Secondary | ICD-10-CM | POA: Diagnosis present

## 2016-09-21 DIAGNOSIS — Z955 Presence of coronary angioplasty implant and graft: Secondary | ICD-10-CM

## 2016-09-21 DIAGNOSIS — E785 Hyperlipidemia, unspecified: Secondary | ICD-10-CM | POA: Diagnosis present

## 2016-09-21 DIAGNOSIS — F17210 Nicotine dependence, cigarettes, uncomplicated: Secondary | ICD-10-CM | POA: Diagnosis present

## 2016-09-21 DIAGNOSIS — I96 Gangrene, not elsewhere classified: Principal | ICD-10-CM | POA: Diagnosis present

## 2016-09-21 DIAGNOSIS — S8992XA Unspecified injury of left lower leg, initial encounter: Secondary | ICD-10-CM

## 2016-09-21 HISTORY — DX: Cutaneous abscess of left lower limb: L02.416

## 2016-09-21 LAB — CBC WITH DIFFERENTIAL/PLATELET
Basophils Absolute: 0 10*3/uL (ref 0–0.1)
Basophils Relative: 0 %
Eosinophils Absolute: 0 10*3/uL (ref 0–0.7)
Eosinophils Relative: 0 %
HEMATOCRIT: 48.5 % (ref 40.0–52.0)
HEMOGLOBIN: 16.4 g/dL (ref 13.0–18.0)
LYMPHS ABS: 1.3 10*3/uL (ref 1.0–3.6)
LYMPHS PCT: 8 %
MCH: 33.7 pg (ref 26.0–34.0)
MCHC: 33.9 g/dL (ref 32.0–36.0)
MCV: 99.5 fL (ref 80.0–100.0)
Monocytes Absolute: 0.8 10*3/uL (ref 0.2–1.0)
Monocytes Relative: 5 %
NEUTROS ABS: 13.1 10*3/uL — AB (ref 1.4–6.5)
NEUTROS PCT: 87 %
Platelets: 214 10*3/uL (ref 150–440)
RBC: 4.87 MIL/uL (ref 4.40–5.90)
RDW: 13.8 % (ref 11.5–14.5)
WBC: 15.3 10*3/uL — ABNORMAL HIGH (ref 3.8–10.6)

## 2016-09-21 LAB — COMPREHENSIVE METABOLIC PANEL
ALT: 16 U/L — AB (ref 17–63)
ANION GAP: 9 (ref 5–15)
AST: 18 U/L (ref 15–41)
Albumin: 4 g/dL (ref 3.5–5.0)
Alkaline Phosphatase: 47 U/L (ref 38–126)
BUN: 17 mg/dL (ref 6–20)
CHLORIDE: 102 mmol/L (ref 101–111)
CO2: 24 mmol/L (ref 22–32)
CREATININE: 0.95 mg/dL (ref 0.61–1.24)
Calcium: 8.9 mg/dL (ref 8.9–10.3)
GFR calc non Af Amer: >60 mL/min (ref >60)
Glucose, Bld: 144 mg/dL — ABNORMAL HIGH (ref 65–99)
POTASSIUM: 3.5 mmol/L (ref 3.5–5.1)
SODIUM: 135 mmol/L (ref 135–145)
Total Bilirubin: 1.4 mg/dL — ABNORMAL HIGH (ref 0.3–1.2)
Total Protein: 7.5 g/dL (ref 6.5–8.1)

## 2016-09-21 MED ORDER — NITROGLYCERIN 0.4 MG SL SUBL: 0.4000 mg | SUBLINGUAL_TABLET | SUBLINGUAL | Status: DC | PRN

## 2016-09-21 MED ORDER — IOPAMIDOL (ISOVUE-300) INJECTION 61%
100.0000 mL | Freq: Once | INTRAVENOUS | Status: AC | PRN
  Administered 2016-09-21: 100 mL via INTRAVENOUS

## 2016-09-21 MED ORDER — ACETAMINOPHEN 650 MG RE SUPP: 650.0000 mg | Freq: Four times a day (QID) | RECTAL | Status: DC | PRN

## 2016-09-21 MED ORDER — PRAVASTATIN SODIUM 20 MG PO TABS
10.0000 mg | ORAL_TABLET | Freq: Every day | ORAL | Status: DC
  Administered 2016-09-21 – 2016-09-24 (×4): 10 mg via ORAL
  Filled 2016-09-21 (×4): qty 1

## 2016-09-21 MED ORDER — ONDANSETRON HCL 4 MG/2ML IJ SOLN: 4.0000 mg | Freq: Four times a day (QID) | INTRAMUSCULAR | Status: DC | PRN

## 2016-09-21 MED ORDER — LISINOPRIL 10 MG PO TABS
5.0000 mg | ORAL_TABLET | Freq: Every day | ORAL | Status: DC
  Administered 2016-09-22 – 2016-09-24 (×3): 5 mg via ORAL
  Filled 2016-09-21 (×3): qty 1

## 2016-09-21 MED ORDER — PIPERACILLIN-TAZOBACTAM 3.375 G IVPB 30 MIN
3.3750 g | Freq: Once | INTRAVENOUS | Status: AC
  Administered 2016-09-21: 3.375 g via INTRAVENOUS

## 2016-09-21 MED ORDER — PIPERACILLIN-TAZOBACTAM 3.375 G IVPB 30 MIN
INTRAVENOUS | Status: AC
  Filled 2016-09-21: qty 50

## 2016-09-21 MED ORDER — IBUPROFEN 600 MG PO TABS
600.0000 mg | ORAL_TABLET | ORAL | Status: AC
  Administered 2016-09-21: 600 mg via ORAL
  Filled 2016-09-21: qty 1

## 2016-09-21 MED ORDER — PIPERACILLIN-TAZOBACTAM 3.375 G IVPB
3.3750 g | Freq: Three times a day (TID) | INTRAVENOUS | Status: DC
  Administered 2016-09-21 – 2016-09-24 (×9): 3.375 g via INTRAVENOUS
  Filled 2016-09-21 (×7): qty 50

## 2016-09-21 MED ORDER — VANCOMYCIN HCL 10 G IV SOLR
2000.0000 mg | Freq: Two times a day (BID) | INTRAVENOUS | Status: DC
  Administered 2016-09-22 – 2016-09-25 (×8): 2000 mg via INTRAVENOUS
  Filled 2016-09-21 (×10): qty 2000

## 2016-09-21 MED ORDER — ASPIRIN 325 MG PO TABS
325.0000 mg | ORAL_TABLET | Freq: Every day | ORAL | Status: DC
  Administered 2016-09-22 – 2016-09-25 (×4): 325 mg via ORAL
  Filled 2016-09-21 (×5): qty 1

## 2016-09-21 MED ORDER — ALBUTEROL SULFATE (2.5 MG/3ML) 0.083% IN NEBU: 3.0000 mL | INHALATION_SOLUTION | RESPIRATORY_TRACT | Status: DC | PRN

## 2016-09-21 MED ORDER — ACETAMINOPHEN 325 MG PO TABS: 650.0000 mg | ORAL_TABLET | Freq: Four times a day (QID) | ORAL | Status: DC | PRN

## 2016-09-21 MED ORDER — OXYCODONE-ACETAMINOPHEN 5-325 MG PO TABS
2.0000 | ORAL_TABLET | ORAL | Status: AC
  Administered 2016-09-21: 2 via ORAL
  Filled 2016-09-21: qty 2

## 2016-09-21 MED ORDER — VANCOMYCIN HCL IN DEXTROSE 1-5 GM/200ML-% IV SOLN
1000.0000 mg | Freq: Once | INTRAVENOUS | Status: DC
  Filled 2016-09-21: qty 200

## 2016-09-21 MED ORDER — ENOXAPARIN SODIUM 40 MG/0.4ML ~~LOC~~ SOLN
40.0000 mg | SUBCUTANEOUS | Status: DC
  Administered 2016-09-21 – 2016-09-23 (×3): 40 mg via SUBCUTANEOUS
  Filled 2016-09-21 (×3): qty 0.4

## 2016-09-21 MED ORDER — VANCOMYCIN HCL IN DEXTROSE 1-5 GM/200ML-% IV SOLN
1000.0000 mg | Freq: Once | INTRAVENOUS | Status: AC
  Administered 2016-09-21: 1000 mg via INTRAVENOUS
  Filled 2016-09-21: qty 200

## 2016-09-21 MED ORDER — HYDROCODONE-ACETAMINOPHEN 5-325 MG PO TABS
1.0000 | ORAL_TABLET | ORAL | Status: DC | PRN
  Administered 2016-09-21 – 2016-09-25 (×17): 2 via ORAL
  Administered 2016-09-25: 1 via ORAL
  Filled 2016-09-21 (×11): qty 2
  Filled 2016-09-21: qty 1
  Filled 2016-09-21 (×6): qty 2

## 2016-09-21 MED ORDER — METOPROLOL TARTRATE 50 MG PO TABS
50.0000 mg | ORAL_TABLET | Freq: Two times a day (BID) | ORAL | Status: DC
Start: 2016-09-21 — End: 2016-09-23
  Administered 2016-09-21 – 2016-09-22 (×3): 50 mg via ORAL
  Filled 2016-09-21 (×3): qty 1

## 2016-09-21 MED ORDER — ONDANSETRON HCL 4 MG PO TABS: 4.0000 mg | ORAL_TABLET | Freq: Four times a day (QID) | ORAL | Status: DC | PRN

## 2016-09-21 MED ORDER — CITALOPRAM HYDROBROMIDE 20 MG PO TABS
40.0000 mg | ORAL_TABLET | Freq: Every day | ORAL | Status: DC
  Administered 2016-09-22 – 2016-09-25 (×4): 40 mg via ORAL
  Filled 2016-09-21 (×4): qty 2

## 2016-09-21 NOTE — ED Notes (Signed)
Patient transported to CT 

## 2016-09-21 NOTE — ED Provider Notes (Signed)
[facility] Emergency Department Provider Note   ____________________________________________   First MD Initiated Contact with Patient 09/21/16 1520     (approximate)  I have reviewed the triage vital signs and the nursing notes.   HISTORY  Chief Complaint leg wound    HPI Jeffrey Alvarado is a 56 y.o. male since Wednesday has been experiencing pain and increasing redness over his left lower shin. Today he noticed that he has drainage and yellow from the left lower shin "hole in it". Reports that he fell in December and had cellulitis that was treated and got better, but seems to come back the same spot. There is painful, shoots pain up his leg. The left lower leg is red and warm and swollen. No numbness or tingling. He is able to walk on it and work today but reports his very uncomfortable and rates his pain as about a 10 out of 10 when walking on it  History of hypertension. Denies history of diabetes.  Past Medical History:  Diagnosis Date  . Coronary artery disease 2009   s/p stent to RCA  . Depression   . Dyslipidemia   . History of myocardial infarction   . History of rectal bleeding   . Hypertension   . Obesity   . Obesity   . Obstructive sleep apnea     There are no active problems to display for this patient.   Past Surgical History:  Procedure Laterality Date  . ANKLE SURGERY Left   . CARDIAC CATHETERIZATION  2009   s/p right coronary stent     Prior to Admission medications   Medication Sig Start Date End Date Taking? Authorizing Provider  albuterol (PROVENTIL HFA;VENTOLIN HFA) 108 (90 Base) MCG/ACT inhaler Inhale 2 puffs into the lungs every 4 (four) hours as needed for wheezing or shortness of breath. 01/17/16   Triplett, Rulon Eisenmenger B, FNP  aspirin 325 MG tablet Take 325 mg by mouth daily.      [provider]  azithromycin (ZITHROMAX Z-PAK) 250 MG tablet Take 2 tablets (500 mg) on  Day 1,  followed by 1 tablet (250 mg)  once daily on Days 2 through 5. 01/17/16   Triplett, Cari B, FNP  buPROPion (WELLBUTRIN) 100 MG tablet Take 100 mg by mouth 2 (two) times daily.      [provider]  citalopram (CELEXA) 40 MG tablet Take 40 mg by mouth daily.    [provider]  HYDROcodone-acetaminophen (NORCO/VICODIN) 5-325 MG tablet Take 1 tablet by mouth every 4 (four) hours as needed for moderate pain. 06/07/15   Triplett, Cari B, FNP  lisinopril (PRINIVIL,ZESTRIL) 5 MG tablet Take 5 mg by mouth daily.      [provider]  lovastatin (MEVACOR) 20 MG tablet Take 20 mg by mouth at bedtime.      [provider]  metoprolol tartrate (LOPRESSOR) 25 MG tablet Take 25 mg by mouth 2 (two) times daily.      [provider]  nitroGLYCERIN (NITROSTAT) 0.4 MG SL tablet Place 0.4 mg under the tongue every 5 (five) minutes as needed.      [provider]  pravastatin (PRAVACHOL) 80 MG tablet Take 80 mg by mouth at bedtime.      [provider]  predniSONE (DELTASONE) 10 MG tablet Take 5 tablets (50 mg total) by mouth daily. 01/17/16   Triplett, Cari B, FNP  sertraline (ZOLOFT) 100 MG tablet Take 100 mg by mouth daily.  [provider]  sulfamethoxazole-trimethoprim (BACTRIM DS,SEPTRA DS) 800-160 MG tablet Take 1 tablet by mouth 2 (two) times daily. 05/09/16   Joni Reining, PA-C  traMADol (ULTRAM) 50 MG tablet Take 1 tablet (50 mg total) by mouth every 6 (six) hours as needed for moderate pain. 05/09/16   Joni Reining, PA-C    Allergies Patient has no known allergies.  Family History  Problem Relation Age of Onset  . Atrial fibrillation Unknown        FH  . Other Daughter        liver transplant    Social History Social History  Substance Use Topics  . Smoking status: Current Every Day Smoker  . Smokeless tobacco: Never Used  . Alcohol use No    Review of Systems Constitutional: Fever no chills . Denies fatigue Eyes: No visual changes. ENT: No  sore throat. Cardiovascular: Denies chest pain. Respiratory: Denies shortness of breath. Gastrointestinal: No abdominal pain.  No nausea, no vomiting.  No diarrhea.  No constipation. Genitourinary: Negative for dysuria. Musculoskeletal:  See history of present illness Skin: Negative for rash. Neurological: Negative for headaches, focal weakness or numbness.    ____________________________________________   PHYSICAL EXAM:  VITAL SIGNS: ED Triage Vitals  Enc Vitals Group     BP 09/21/16 1451 137/86     Pulse Rate 09/21/16 1451 75     Resp 09/21/16 1451 18     Temp 09/21/16 1451 100 F (37.8 C)     Temp Source 09/21/16 1451 Oral     SpO2 09/21/16 1451 96 %     Weight 09/21/16 1452 268 lb (121.6 kg)     Height 09/21/16 1452 5\' 11"  (1.803 m)     Head Circumference --      Peak Flow --      Pain Score 09/21/16 1451 10     Pain Loc --      Pain Edu? --      Excl. in GC? --     Constitutional: Alert and oriented. Well appearing and in no acute distress. Eyes: Conjunctivae are normal. Head: Atraumatic. Nose: No congestion/rhinnorhea. Mouth/Throat: Mucous membranes are moist. Neck: No stridor.   Cardiovascular: Normal rate, regular rhythm. Grossly normal heart sounds.  Good peripheral circulation. Respiratory: Normal respiratory effort.  No retractions. Lungs CTAB. Gastrointestinal: Soft and nontender. No distention. Musculoskeletal:  Lower Extremities  No edema. Normal DP/PT pulses bilateral with good cap refill.  Normal neuro-motor function lower extremities bilateral.  RIGHT Right lower extremity demonstrates normal strength, good use of all muscles. No edema bruising or contusions of the right hip, right knee, right ankle. Full range of motion of the right lower extremity without pain. No pain on axial loading. No evidence of trauma.  LEFT Left lower extremity demonstrates normal strength, good use of all muscles. No edema bruising or contusions of the hip. Full  range of motion of the left lower extremity but with pain along the left anterior calf. No pain on axial loading. See upper limit clinical media, the patient has obvious circumferential erythema, rather large punctate hole draining pus from the central region of the leg.   Neurologic:  Normal speech and language. No gross focal neurologic deficits are appreciated.  Skin:  Skin is warm, dry and intact. No rash noted. Psychiatric: Mood and affect are normal. Speech and behavior are normal.  ____________________________________________   LABS (all labs ordered are listed, but only abnormal results are displayed)  Labs Reviewed  COMPREHENSIVE  METABOLIC PANEL - Abnormal; Notable for the following:       Result Value   Glucose, Bld 144 (*)    ALT 16 (*)    Total Bilirubin 1.4 (*)    All other components within normal limits  CBC WITH DIFFERENTIAL/PLATELET - Abnormal; Notable for the following:    WBC 15.3 (*)    Neutro Abs 13.1 (*)    All other components within normal limits  AEROBIC CULTURE (SUPERFICIAL SPECIMEN)  CULTURE, BLOOD (ROUTINE X 2)  CULTURE, BLOOD (ROUTINE X 2)   ____________________________________________  EKG   ____________________________________________  RADIOLOGY  Dg Tibia/fibula Left  Result Date: 09/21/2016 CLINICAL DATA:  Initial evaluation for left lower extremity swelling, cellulitis. EXAM: LEFT TIBIA AND FIBULA - 2 VIEW COMPARISON:  Prior radiograph from 05/09/2016. FINDINGS: Diffuse soft tissue swelling seen throughout the left lower leg, greatest laterally. Pocket of soft tissue emphysema present at the lateral aspect of the distal leg, measuring 5.2 cm in size. No dissecting soft tissue emphysema seen proximally. No radiopaque foreign body. Vascular calcifications noted. No acute osseus abnormality. No evidence for osteomyelitis. Sequelae of prior ORIF seen at the distal fibula. IMPRESSION: 1. Soft tissue swelling within the left leg, concerning for  cellulitis/infection. Superimposed 5.2 cm pocket of soft tissue emphysema may reflect abscess with internal gas or possibly soft tissue wound with open communication to the overlying skin. No radiopaque foreign body. 2. No acute osseous abnormality. Electronically Signed   By: Rise MuBenjamin  McClintock M.D.   On: 09/21/2016 17:08    ____________________________________________   PROCEDURES  Procedure(s) performed: None  Procedures  Critical Care performed: No  ____________________________________________   INITIAL IMPRESSION / ASSESSMENT AND PLAN / ED COURSE  Pertinent labs & imaging results that were available during my care of the patient were reviewed by me and considered in my medical decision making (see chart for details).  Patient with obvious evidence of infection on exam, there is clearly cellulitis with a circumferential component involving the left lower leg. The patient has intact dorsalis pedis and posterior tibial pulses without evidence of cyanosis. He has notable erythema, and a central tract draining purulent fluid. Probable deep abscess that may be open in the skin or sinus tract with osteomyelitis is strongly considered.  ----------------------------------------- 5:31 PM on 09/21/2016 -----------------------------------------  Patient reports pain improved. Agreeable with plan for admission. Discussed with Dr. Joice LoftsPoggi, who recommends obtaining an MRI of the lower extremity to evaluate further for possible underlying osteomyelitis. This recommendationw as discussed with the hospitalist, Dr. Lubertha SouthSunani agreeable with plan for admission, broad-spectrum antibiotics and consultation likely with general surgery or orthopedics      ____________________________________________   FINAL CLINICAL IMPRESSION(S) / ED DIAGNOSES  Final diagnoses:    Soft tissue injury of left lower leg, initial encounter  Cellulitis of left lower extremity without foot  Complicated soft tissue  infection left lower extremity     NEW MEDICATIONS STARTED DURING THIS VISIT:  New Prescriptions   No medications on file     Note:  This document was prepared using Dragon voice recognition software and may include unintentional dictation errors.     Sharyn CreamerQuale, Mark, MD 09/21/16 [phone]

## 2016-09-21 NOTE — ED Notes (Signed)
Patient transported to Ultrasound 

## 2016-09-21 NOTE — ED Notes (Signed)
Attempted IV stick in right AC and left hand without success

## 2016-09-21 NOTE — H&P (Signed)
[facility] [place] [facility]   PATIENT NAME: Jeffrey Alvarado    MR#:  [id number]  DATE OF BIRTH:  Jun 16, 1960  DATE OF ADMISSION:  09/21/2016  PRIMARY CARE PHYSICIAN: Evelene Croon, MD   REQUESTING/REFERRING PHYSICIAN: Dr. Sharyn Creamer  CHIEF COMPLAINT:   Chief Complaint  Patient presents with  . leg wound    HISTORY OF PRESENT ILLNESS:  Jeffrey Alvarado  is a 56 y.o. male with a known history of Hypertension, obesity, obstructive sleep apnea, depression, hyperlipidemia, previous history of MI who presents to the hospital due to left lower extremity redness swelling and pain. Patient says he developed swelling in his left lower extremity about 3-4 days ago and has progressively gotten worse. It's become more red and inflamed and more painful to touch and to ambulate with. He also admits to some chills but no documented fever at home. He also admits to night sweats. Patient presents to the hospital and was noted to have a significant left lower extremity cellulitis with abscess and hospitalist services were contacted further treatment and evaluation. Patient denies any chest pains, shortness of breath, nausea, vomiting, abdominal pain, headache, dizziness or any other associated symptoms presently.  PAST MEDICAL HISTORY:   Past Medical History:  Diagnosis Date  . Coronary artery disease 2009   s/p stent to RCA  . Depression   . Dyslipidemia   . History of myocardial infarction   . History of rectal bleeding   . Hypertension   . Obesity   . Obesity   . Obstructive sleep apnea     PAST SURGICAL HISTORY:   Past Surgical History:  Procedure Laterality Date  . ANKLE SURGERY Left   . CARDIAC CATHETERIZATION  2009   s/p right coronary stent     SOCIAL HISTORY:   Social History  Substance Use Topics  . Smoking status: Current Every Day Smoker    Packs/day: 1.00    Years: 35.00    Types: Cigarettes  . Smokeless tobacco: Never Used  . Alcohol  use Yes     Comment: Socially    FAMILY HISTORY:   Family History  Problem Relation Age of Onset  . Atrial fibrillation Unknown        FH  . Other Daughter        liver transplant  . Atrial fibrillation Mother   . Diabetes Father     DRUG ALLERGIES:  No Known Allergies  REVIEW OF SYSTEMS:   Review of Systems  Constitutional: Positive for chills. Negative for fever and weight loss.  HENT: Negative for congestion, nosebleeds and tinnitus.   Eyes: Negative for blurred vision, double vision and redness.  Respiratory: Negative for cough, hemoptysis and shortness of breath.   Cardiovascular: Positive for leg swelling (LLE swelling. ). Negative for chest pain, orthopnea and PND.  Gastrointestinal: Negative for abdominal pain, diarrhea, melena, nausea and vomiting.  Genitourinary: Negative for dysuria, hematuria and urgency.  Musculoskeletal: Negative for falls and joint pain.  Neurological: Negative for dizziness, tingling, sensory change, focal weakness, seizures, weakness and headaches.  Endo/Heme/Allergies: Negative for polydipsia. Does not bruise/bleed easily.  Psychiatric/Behavioral: Negative for depression and memory loss. The patient is not nervous/anxious.     MEDICATIONS AT HOME:   Prior to Admission medications   Medication Sig Start Date End Date Taking? Authorizing Provider  albuterol (PROVENTIL HFA;VENTOLIN HFA) 108 (90 Base) MCG/ACT inhaler Inhale 2 puffs into the lungs every 4 (four) hours as needed for wheezing or shortness of breath.  01/17/16  Yes Triplett, Cari B, FNP  aspirin 325 MG tablet Take 325 mg by mouth daily.     Yes [provider]  citalopram (CELEXA) 40 MG tablet Take 40 mg by mouth daily.   Yes [provider]  lisinopril (PRINIVIL,ZESTRIL) 5 MG tablet Take 5 mg by mouth daily.     Yes [provider]  metoprolol tartrate (LOPRESSOR) 50 MG tablet Take 50 mg by mouth 2 (two) times daily.    Yes [provider]   nitroGLYCERIN (NITROSTAT) 0.4 MG SL tablet Place 0.4 mg under the tongue every 5 (five) minutes as needed.     Yes [provider]  pravastatin (PRAVACHOL) 10 MG tablet Take 10 mg by mouth at bedtime.    Yes [provider]  HYDROcodone-acetaminophen (NORCO/VICODIN) 5-325 MG tablet Take 1 tablet by mouth every 4 (four) hours as needed for moderate pain. Patient not taking: Reported on 09/21/2016 06/07/15   Kem Boroughs B, FNP  predniSONE (DELTASONE) 10 MG tablet Take 5 tablets (50 mg total) by mouth daily. Patient not taking: Reported on 09/21/2016 01/17/16   Chinita Pester, FNP  traMADol (ULTRAM) 50 MG tablet Take 1 tablet (50 mg total) by mouth every 6 (six) hours as needed for moderate pain. Patient not taking: Reported on 09/21/2016 05/09/16   Joni Reining, PA-C      VITAL SIGNS:  Blood pressure 120/84, pulse 90, temperature 99.9 F (37.7 C), temperature source Oral, resp. rate 18, height 5\' 11"  (1.803 m), weight 121.6 kg (268 lb), SpO2 96 %.  PHYSICAL EXAMINATION:  Physical Exam  GENERAL:  56 y.o.-year-old patient lying in the bed in no acute distress.  EYES: Pupils equal, round, reactive to light and accommodation. No scleral icterus. Extraocular muscles intact.  HEENT: Head atraumatic, normocephalic. Oropharynx and nasopharynx clear. No oropharyngeal erythema, moist oral mucosa  NECK:  Supple, no jugular venous distention. No thyroid enlargement, no tenderness.  LUNGS: Normal breath sounds bilaterally, no wheezing, rales, rhonchi. No use of accessory muscles of respiration.  CARDIOVASCULAR: S1, S2 RRR. No murmurs, rubs, gallops, clicks.  ABDOMEN: Soft, nontender, nondistended. Bowel sounds present. No organomegaly or mass.  EXTREMITIES: No pedal edema, cyanosis, or clubbing. + 2 pedal & radial pulses b/l.  Left lower extremity redness swelling and an open area with no acute drainage noted. NEUROLOGIC: Cranial nerves II through XII are intact. No focal Motor or  sensory deficits appreciated b/l PSYCHIATRIC: The patient is alert and oriented x 3.  SKIN: No obvious rash, lesion, or ulcer. Left lower extremity redness swelling and induration consistent with cellulitis.  LABORATORY PANEL:   CBC  Recent Labs Lab 09/21/16 1453  WBC 15.3*  HGB 16.4  HCT 48.5  PLT 214   ------------------------------------------------------------------------------------------------------------------  Chemistries   Recent Labs Lab 09/21/16 1453  NA 135  K 3.5  CL 102  CO2 24  GLUCOSE 144*  BUN 17  CREATININE 0.95  CALCIUM 8.9  AST 18  ALT 16*  ALKPHOS 47  BILITOT 1.4*   ------------------------------------------------------------------------------------------------------------------  Cardiac Enzymes No results for input(s): TROPONINI in the last 168 hours. ------------------------------------------------------------------------------------------------------------------  RADIOLOGY:  Dg Tibia/fibula Left  Result Date: 09/21/2016 CLINICAL DATA:  Initial evaluation for left lower extremity swelling, cellulitis. EXAM: LEFT TIBIA AND FIBULA - 2 VIEW COMPARISON:  Prior radiograph from 05/09/2016. FINDINGS: Diffuse soft tissue swelling seen throughout the left lower leg, greatest laterally. Pocket of soft tissue emphysema present at the lateral aspect of the distal leg, measuring 5.2 cm  in size. No dissecting soft tissue emphysema seen proximally. No radiopaque foreign body. Vascular calcifications noted. No acute osseus abnormality. No evidence for osteomyelitis. Sequelae of prior ORIF seen at the distal fibula. IMPRESSION: 1. Soft tissue swelling within the left leg, concerning for cellulitis/infection. Superimposed 5.2 cm pocket of soft tissue emphysema may reflect abscess with internal gas or possibly soft tissue wound with open communication to the overlying skin. No radiopaque foreign body. 2. No acute osseous abnormality. Electronically Signed   By:  Rise MuBenjamin  McClintock M.D.   On: 09/21/2016 17:08   [place] Venous Img Lower Unilateral Left  Result Date: 09/21/2016 CLINICAL DATA:  Initial evaluation for acute lower extremity swelling, draining wound. EXAM: Left LOWER EXTREMITY VENOUS DOPPLER ULTRASOUND TECHNIQUE: Gray-scale sonography with graded compression, as well as color Doppler and duplex ultrasound were performed to evaluate the lower extremity deep venous systems from the level of the common femoral vein and including the common femoral, femoral, profunda femoral, popliteal and calf veins including the posterior tibial, peroneal and gastrocnemius veins when visible. The superficial great saphenous vein was also interrogated. Spectral Doppler was utilized to evaluate flow at rest and with distal augmentation maneuvers in the common femoral, femoral and popliteal veins. COMPARISON:  None. FINDINGS: Contralateral Common Femoral Vein: Respiratory phasicity is normal and symmetric with the symptomatic side. No evidence of thrombus. Normal compressibility. Common Femoral Vein: No evidence of thrombus. Normal compressibility, respiratory phasicity and response to augmentation. Saphenofemoral Junction: No evidence of thrombus. Normal compressibility and flow on color Doppler imaging. Profunda Femoral Vein: No evidence of thrombus. Normal compressibility and flow on color Doppler imaging. Femoral Vein: No evidence of thrombus. Normal compressibility, respiratory phasicity and response to augmentation. Popliteal Vein: No evidence of thrombus. Normal compressibility, respiratory phasicity and response to augmentation. Calf Veins: No evidence of thrombus. Normal compressibility and flow on color Doppler imaging. Superficial Great Saphenous Vein: No evidence of thrombus. Normal compressibility and flow on color Doppler imaging. Venous Reflux:  None. Other Findings: Mildly prominent left inguinal lymph node measures 1 cm in short axis but demonstrates a normal  echogenic fatty hilum, may be reactive. IMPRESSION: No evidence of DVT within the left lower extremity. Electronically Signed   By: Rise MuBenjamin  McClintock M.D.   On: 09/21/2016 17:34     IMPRESSION AND PLAN:   56 year old male with past medical history of obesity, obstructive sleep apnea, hypertension, history of previous coronary disease and status post stent placement, history of previous MI who presents to the hospital due to left lower extremity redness swelling and pain.  1. Left lower extremity cellulitis with abscess-just the cause of patient's above symptoms as stated. Left lower extremity Doppler was negative for DVT. -I will get a contrast left lower extremity CT scan to further evaluate this. Patient likely will need possible incision and drainage. -Start patient on broad-spectrum IV antibiotics vancomycin, Zosyn, follow blood, bleeding cultures.  2. Leukocytosis-secondary to #1. Next-follow with IV antibiotic therapy.  3. Hyperlipidemia-continue Pravachol.  4. Essential hypertension-continue metoprolol, Seroquel.  5. Depression-continue Celexa.  6. History of previous coronary artery disease and stent placement-continue aspirin, statin, beta blocker and, ACE inhibitor.  All the records are reviewed and case discussed with ED provider. Management plans discussed with the patient, family and they are in agreement.  CODE STATUS: Full code  TOTAL TIME TAKING CARE OF THIS PATIENT: 45 minutes.    [place] SirenSAINANI,Cleo Santucci J M.D on 09/21/2016 at 5:55 PM  Between 7am to 6pm - Pager - [phone]  After 6pm  go to [email or web address] - password [email or web address] [facility]  [facility]  Office  [phone]  CC: Primary care physician; Evelene Croon, MD

## 2016-09-21 NOTE — ED Notes (Signed)
Pt reports that he fell back in the winter and injured left lower leg - he states he was seen in the ER and given oral atb and told he had cellulitis - he states the "hole" has never healed - he started with pain Wednesday and he was "smashing pus and fluid out of it" - since Wed the area has become red/swollen/and painful - lower leg is warm to touch - pt has area that is open approx 10cm around with depth noted and yellow center - area is draining yellow/clear fluid - no odor noted

## 2016-09-21 NOTE — Consult Note (Signed)
ORTHOPAEDIC CONSULTATION  REQUESTING PHYSICIAN: Houston Siren, MD  Chief Complaint:   Left lower leg pain and swelling  History of Present Illness: Jeffrey Alvarado is a 56 y.o. male with multiple medical problems including coronary artery disease, status post MI, hypertension, obesity, and sleep apnea who was in his usual state of health until about 3 days ago when he began to notice increased discomfort in the lateral aspect of his left lower leg. He denies any specific injury to the area. He does not recall bumping it or scraping it against anything. His symptoms worsened over the next several days. 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 Because he tried to return to work today without 4 hours had to leave because of severe pain. He presented emergency room where he was diagnosed with a left lower leg cellulitis and admitted for further evaluation and treatment. The patient's past history is notable for having developed a subcutaneous abscess over the lateral aspect of his knee several years ago which was treated by the general surgery department at [facility] with I&D and wound packing with subsequent resolution of his MRSA infection. The patient also is now 22 years status post an open reduction and internal fixation of a left distal fibular fracture performed in [place]. The patient denies any subsequent problems with this ankle. He has never had any swelling, pain, erythema, or drainage from around the surgical incision site.  Past Medical History:  Diagnosis Date  . Coronary artery disease 2009   s/p stent to RCA  . Depression   . Dyslipidemia   . History of myocardial infarction   . History of rectal bleeding   . Hypertension   . Obesity   . Obesity   . Obstructive sleep apnea    Past Surgical History:  Procedure Laterality Date  . ANKLE  SURGERY Left   . CARDIAC CATHETERIZATION  2009   s/p right coronary stent    Social History   Social History  . Marital status: Single    Spouse name: N/A  . Number of children: 2  . Years of education: N/A   Social History Main Topics  . Smoking status: Current Every Day Smoker    Packs/day: 1.00    Years: 35.00    Types: Cigarettes  . Smokeless tobacco: Never Used  . Alcohol use Yes     Comment: Socially  . Drug use: No  . Sexual activity: Not Asked   Other Topics Concern  . None   Social History Narrative  . None   Family History  Problem Relation Age of Onset  . Atrial fibrillation Unknown        FH  . Other Daughter        liver transplant  . Atrial fibrillation Mother   . Diabetes Father    No Known Allergies Prior to Admission medications   Medication Sig Start Date End Date Taking? Authorizing Provider  albuterol (PROVENTIL HFA;VENTOLIN HFA) 108 (90 Base) MCG/ACT inhaler Inhale 2 puffs into the lungs every 4 (four) hours as needed for wheezing or shortness of breath. 01/17/16  Yes Triplett, Cari B, FNP  aspirin 325 MG tablet Take 325 mg by mouth daily.     Yes [provider]  citalopram (CELEXA) 40 MG tablet Take 40 mg by mouth daily.   Yes [provider]  lisinopril (PRINIVIL,ZESTRIL) 5 MG tablet Take 5 mg by mouth daily.  Yes [provider]  metoprolol tartrate (LOPRESSOR) 50 MG tablet Take 50 mg by mouth 2 (two) times daily.    Yes [provider]  nitroGLYCERIN (NITROSTAT) 0.4 MG SL tablet Place 0.4 mg under the tongue every 5 (five) minutes as needed.     Yes [provider]  pravastatin (PRAVACHOL) 10 MG tablet Take 10 mg by mouth at bedtime.    Yes [provider]  HYDROcodone-acetaminophen (NORCO/VICODIN) 5-325 MG tablet Take 1 tablet by mouth every 4 (four) hours as needed for moderate pain. Patient not taking: Reported on 09/21/2016 06/07/15   Kem Boroughs B, FNP  predniSONE (DELTASONE) 10  MG tablet Take 5 tablets (50 mg total) by mouth daily. Patient not taking: Reported on 09/21/2016 01/17/16   Chinita Pester, FNP  traMADol (ULTRAM) 50 MG tablet Take 1 tablet (50 mg total) by mouth every 6 (six) hours as needed for moderate pain. Patient not taking: Reported on 09/21/2016 05/09/16   Joni Reining, PA-C   Dg Tibia/fibula Left  Result Date: 09/21/2016 CLINICAL DATA:  Initial evaluation for left lower extremity swelling, cellulitis. EXAM: LEFT TIBIA AND FIBULA - 2 VIEW COMPARISON:  Prior radiograph from 05/09/2016. FINDINGS: Diffuse soft tissue swelling seen throughout the left lower leg, greatest laterally. Pocket of soft tissue emphysema present at the lateral aspect of the distal leg, measuring 5.2 cm in size. No dissecting soft tissue emphysema seen proximally. No radiopaque foreign body. Vascular calcifications noted. No acute osseus abnormality. No evidence for osteomyelitis. Sequelae of prior ORIF seen at the distal fibula. IMPRESSION: 1. Soft tissue swelling within the left leg, concerning for cellulitis/infection. Superimposed 5.2 cm pocket of soft tissue emphysema may reflect abscess with internal gas or possibly soft tissue wound with open communication to the overlying skin. No radiopaque foreign body. 2. No acute osseous abnormality. Electronically Signed   By: Rise Mu M.D.   On: 09/21/2016 17:08   Ct Tibia Fibula Left W Contrast  Result Date: 09/21/2016 CLINICAL DATA:  Left lower extremity erythema and swelling with pain. EXAM: CT OF THE LOWER LEFT EXTREMITY WITH CONTRAST TECHNIQUE: Multidetector CT imaging of the lower left extremity (femoral condyles through ankle) was performed according to the standard protocol following intravenous contrast administration. COMPARISON:  None. CONTRAST:  ISOVUE-300 IOPAMIDOL (ISOVUE-300) INJECTION 61% FINDINGS: Bones/Joint/Cartilage No acute fracture nor bone destruction. No dislocations. Metallic streak artifacts project  over the distal fibular diaphysis consistent plate and screw fixation hardware. Ligaments Suboptimally assessed by CT. Muscles and Tendons Negative Soft tissues Soft tissue defect at the junction of the middle and distal third of the left leg laterally with subcutaneous emphysema deep to the defect. The subcutaneous air in seen tracking superficial to the extensor digitorum longus muscle along its mid and distal third. There appears be a small air-filled cavity deep to the cutaneous defect measuring 2.3 x 0.7 cm in craniocaudad by transverse dimension there may represent in the PACU weighted abscess cavity either through instrumentation or spontaneously. Although the constellation of these findings may be secondary to instrumentation and abscess drainage a soft tissue ulcer with possible fasciitis accounting for subcutaneous emphysema is not entirely exclude. No remaining focal fluid collections are identified to suggest the presence of a drainable abscess. No intramuscular fluid collection or pyomyositis is identified. IMPRESSION: 1. Cellulitis along the lateral aspect of the left leg with more focal soft tissue defect at the junction of the middle and distal third. An approximately 2.3 x 0.7 cm subcutaneous air-filled cavity  contiguous with the soft tissue defect is noted which may represent changes secondary to abscess drainage, either through instrumentation or spontaneously. The possibility of soft tissue ulcer with changes of a fasciitis are not entirely excluded given the scant amount of subcutaneous tracking air overlying the extensor digitorum longus muscle. 2. No acute osseous involvement. Plate and screw fixation of the distal fibular diaphysis. Electronically Signed   By: Tollie Eth M.D.   On: 09/21/2016 18:59   US Venous Img Lower Unilateral Left  Result Date: 09/21/2016 CLINICAL DATA:  Initial evaluation for acute lower extremity swelling, draining wound. EXAM: Left LOWER EXTREMITY VENOUS DOPPLER  ULTRASOUND TECHNIQUE: Gray-scale sonography with graded compression, as well as color Doppler and duplex ultrasound were performed to evaluate the lower extremity deep venous systems from the level of the common femoral vein and including the common femoral, femoral, profunda femoral, popliteal and calf veins including the posterior tibial, peroneal and gastrocnemius veins when visible. The superficial great saphenous vein was also interrogated. Spectral Doppler was utilized to evaluate flow at rest and with distal augmentation maneuvers in the common femoral, femoral and popliteal veins. COMPARISON:  None. FINDINGS: Contralateral Common Femoral Vein: Respiratory phasicity is normal and symmetric with the symptomatic side. No evidence of thrombus. Normal compressibility. Common Femoral Vein: No evidence of thrombus. Normal compressibility, respiratory phasicity and response to augmentation. Saphenofemoral Junction: No evidence of thrombus. Normal compressibility and flow on color Doppler imaging. Profunda Femoral Vein: No evidence of thrombus. Normal compressibility and flow on color Doppler imaging. Femoral Vein: No evidence of thrombus. Normal compressibility, respiratory phasicity and response to augmentation. Popliteal Vein: No evidence of thrombus. Normal compressibility, respiratory phasicity and response to augmentation. Calf Veins: No evidence of thrombus. Normal compressibility and flow on color Doppler imaging. Superficial Great Saphenous Vein: No evidence of thrombus. Normal compressibility and flow on color Doppler imaging. Venous Reflux:  None. Other Findings: Mildly prominent left inguinal lymph node measures 1 cm in short axis but demonstrates a normal echogenic fatty hilum, may be reactive. IMPRESSION: No evidence of DVT within the left lower extremity. Electronically Signed   By: Rise Mu M.D.   On: 09/21/2016 17:34   [place] Misc Soft Tissue  Result Date: 09/21/2016 CLINICAL DATA:   Initial evaluation for soft tissue swelling, abscess of left leg. EXAM: ULTRASOUND left LOWER EXTREMITY LIMITED TECHNIQUE: Ultrasound examination of the lower extremity soft tissues was performed in the area of clinical concern. COMPARISON:  None. FINDINGS: Targeted ultrasound of the left lower extremity was performed at site of swelling and draining wound. Targeted ultrasound of the area of concern demonstrates diffuse soft tissue swelling and edema. Overlying skin defect measuring approximately 3 mm in size, consistent with draining tract. No discrete abscess or other drainable fluid collection identified. IMPRESSION: Diffuse soft tissue swelling and edema with associated draining track at area of concern in the left lower extremity, concerning for infection/cellulitis. No other discrete abscess or drainable fluid collection identified. Electronically Signed   By: Rise Mu M.D.   On: 09/21/2016 18:29    Positive ROS: All other systems have been reviewed and were otherwise negative with the exception of those mentioned in the HPI and as above.  Physical Exam: General:  Alert, no acute distress Psychiatric:  Patient is competent for consent with normal mood and affect   Cardiovascular:  No pedal edema Respiratory:  No wheezing, non-labored breathing GI:  Abdomen is soft and non-tender Skin:  No lesions in the area of chief complaint Neurologic:  Sensation intact distally Lymphatic:  No axillary or cervical lymphadenopathy  Orthopedic Exam:  Orthopedic examination is limited to the left lower leg and foot. There is significant erythema and moderate swelling around the lower half of the lower leg, especially laterally, centered over a 1 cm circumferential skin defect. There appears to be tracking subcutaneous tracking both anteriorly and proximally as well as posteriorly for several centimeters in each direction. He has moderate tenderness to palpation around this area, but there is no  purulent drainage that can be expressed at this time.  Over the lateral aspect of the distal fibula is a well-healed surgical incision. There is no tenderness to palpation over the distal fibula itself and the erythema does not extend all the way down to the distal fibula. He is neurovascularly intact to his left foot, demonstrating the ability to dorsiflex and plantarflex his ankle and toes without any discomfort or difficulty. Sensation is intact to light touch to all distributions of his left lower leg and foot. He has good capillary refill to his left foot.  X-rays:  X-rays of his left ankle are available for review. These films demonstrate a well-healed distal fibular fracture with retained surgical hardware. The hardware appears to be in excellent position and without evidence of loosening. There is no osteolysis beneath the plate or around the screws to suggest a chronic osteomyelitis. The ankle mortise is anatomic.  A CT scan of the left tibia and fibula also is available for review. By my review, the is evidence of a skin defect laterally with subcutaneous extension that does not appear to go beneath the muscle fascia. There is no extension distally toward the plate, and no obvious fluid collection around the plate or tracking between the plate and the skin defect site.  Assessment: Left lateral lower leg subcutaneous abscess (now decompressed) with associated lower leg cellulitis.  Plan: Based on the patient's physical examination and x-ray and CT findings, I do not feel that there is any connection between his present lower leg abscess and the retained surgical hardware of his distal fibula from his prior ankle surgery. I agree with the plan for IV antibiotics to treat the cellulitis. It is possible that formal irrigation and debridement of the wound might be necessary, similar to what the patient had to undergo as treatment for his lateral sided left knee infection from several years ago.    Thank you for asking me to participate in the care of this most pleasant Unfortunate man. At this point, I will sign off and let General Surgery continue to observe the wound. If there is any further need of my assistance, or if General Surgery does not feel comfortable managing this wound, please contact me.   Maryagnes AmosJ. Jeffrey Poggi, MD  Beeper #:  [phone]  09/21/2016 10:09 PM

## 2016-09-21 NOTE — Consult Note (Addendum)
Patient ID: Jeffrey Alvarado, male   DOB: 09/19/1960, 56 y.o.   MRN: [id number]  HPI Jeffrey Alvarado is a 56 y.o. male asked to see in consultation by Dr. Cherlynn Kaisersainani regarding a left leg cellulitis. Of note the patient had a previous fracture and has high work on his fibula. He also had a history of a previous infection a couple years ago at outside facility. He now presents with a four-day history of progressive intermittent sharp pain and edema. He also reports significant redness. The pain is worsening when he moves and when he walks. Apparently he did have some chills but no fevers. Patient is obese and smokes a pack of cigarettes a day. He does have a history of coronary artery disease with a previous stent. Over the last day or so he has now noticed some drainage and open wound. I have personally reviewed his CT scan showing evidence an open wound and significant cellulitis and soft tissue infection. There is a few bubbles of air likely related to his open wound. Of concern is the proximity of this soft tissue infection to the previous hardware. No evidence of definitive abscess   HPI  Past Medical History:  Diagnosis Date  . Coronary artery disease 2009   s/p stent to RCA  . Depression   . Dyslipidemia   . History of myocardial infarction   . History of rectal bleeding   . Hypertension   . Obesity   . Obesity   . Obstructive sleep apnea     Past Surgical History:  Procedure Laterality Date  . ANKLE SURGERY Left   . CARDIAC CATHETERIZATION  2009   s/p right coronary stent     Family History  Problem Relation Age of Onset  . Atrial fibrillation Unknown        FH  . Other Daughter        liver transplant  . Atrial fibrillation Mother   . Diabetes Father     Social History Social History  Substance Use Topics  . Smoking status: Current Every Day Smoker    Packs/day: 1.00    Years: 35.00    Types: Cigarettes  . Smokeless tobacco: Never Used  . Alcohol use Yes   Comment: Socially    No Known Allergies  No current facility-administered medications for this encounter.    Current Outpatient Prescriptions  Medication Sig Dispense Refill  . albuterol (PROVENTIL HFA;VENTOLIN HFA) 108 (90 Base) MCG/ACT inhaler Inhale 2 puffs into the lungs every 4 (four) hours as needed for wheezing or shortness of breath. 1 Inhaler 1  . aspirin 325 MG tablet Take 325 mg by mouth daily.      . citalopram (CELEXA) 40 MG tablet Take 40 mg by mouth daily.    Marland Kitchen. lisinopril (PRINIVIL,ZESTRIL) 5 MG tablet Take 5 mg by mouth daily.      . metoprolol tartrate (LOPRESSOR) 50 MG tablet Take 50 mg by mouth 2 (two) times daily.     . nitroGLYCERIN (NITROSTAT) 0.4 MG SL tablet Place 0.4 mg under the tongue every 5 (five) minutes as needed.      . pravastatin (PRAVACHOL) 10 MG tablet Take 10 mg by mouth at bedtime.     Marland Kitchen. HYDROcodone-acetaminophen (NORCO/VICODIN) 5-325 MG tablet Take 1 tablet by mouth every 4 (four) hours as needed for moderate pain. (Patient not taking: Reported on 09/21/2016) 12 tablet 0  . predniSONE (DELTASONE) 10 MG tablet Take 5 tablets (50 mg total) by mouth daily. (Patient  not taking: Reported on 09/21/2016) 25 tablet 0  . traMADol (ULTRAM) 50 MG tablet Take 1 tablet (50 mg total) by mouth every 6 (six) hours as needed for moderate pain. (Patient not taking: Reported on 09/21/2016) 12 tablet 0     Review of Systems Full ROS  was asked and was negative except for the information on the HPI  Physical Exam Blood pressure (!) 120/93, pulse 92, temperature 99.9 F (37.7 C), temperature source Oral, resp. rate 18, height 5\' 11"  (1.803 m), weight 121.6 kg (268 lb), SpO2 97 %. CONSTITUTIONAL: Nontoxic EYES: Pupils are equal, round, , Sclera are non-icteric. EARS, NOSE, MOUTH AND THROAT: The oropharynx is clear. The oral mucosa is pink and moist. Hearing is intact to voice. LYMPH NODES:  Lymph nodes in the neck are normal. RESPIRATORY:  Lungs are clear. There is normal  respiratory effort, with equal breath sounds bilaterally, and without pathologic use of accessory muscles. CARDIOVASCULAR: Heart is regular without murmurs, gallops, or rubs. GI: The abdomen is soft, nontender, and nondistended. There are no palpable masses. There is no hepatosplenomegaly. There are normal bowel sounds in all quadrants. GU: Rectal deferred.   MUSCULOSKELETAL: In his left leg there is significant edema and blanching erythema. There is an open wound measuring 1 cm with exposed fat. No evidence of necrosis. No evidence of subcutaneous air. The distal leg is well perfused and no evidence of compartment syndrome. Bounding pedal distal pulses SKIN: Turgor is good and there are no pathologic skin lesions or ulcers. NEUROLOGIC: Motor and sensation is grossly normal. Cranial nerves are grossly intact. PSYCH:  Oriented to person, place and time. Affect is normal.  Data Reviewed  I have personally reviewed the patient's imaging, laboratory findings and medical records.    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 Patient has [place] in his fibula very close to work this infection is. Concerning for chronic infection of Hardware. Recommend orthopedic consultation regarding this issue. There is no evidence of necrotizing infection or drainable collections at this time. WE will recommend broad-spectrum antibiotics to include MRSA and gram-negative. I'll continue to follow. Case discussed in detail with Dr. Grant Ruts, MD FACS General Surgeon 09/21/2016, 6:44 PM

## 2016-09-21 NOTE — Progress Notes (Addendum)
Pharmacy Antibiotic Note  Jeffrey Alvarado is a 56 y.o. male admitted on 09/21/2016 with cellulitis.  Pharmacy has been consulted for vancomycin and zosyn dosing.  Plan: Vancomycin 2000mg  IV every 12 hours.  Goal trough 10-15 mcg/mL.  Zosyn 3.375gm iv q8h for cellulitis  Vancomycin trough level 8/13@1330   Height: 5\' 11"  (180.3 cm) Weight: 268 lb (121.6 kg) IBW/kg (Calculated) : 75.3  Temp (24hrs), Avg:99.3 F (37.4 C), Min:98 F (36.7 C), Max:100 F (37.8 C)   Recent Labs Lab 09/21/16 1453  WBC 15.3*  CREATININE 0.95    Estimated Creatinine Clearance: 119.3 mL/min (by C-G formula based on SCr of 0.95 mg/dL).    No Known Allergies  Antimicrobials this admission: 8/11 zosyn 3.375gm iv q8h >>  8/11 vancomycin 2gm iv q12h >>    Microbiology results: 8/11 Wound gram positive cocci  Thank you for allowing pharmacy to be a part of this patient's care.  Gerre PebblesGarrett Timmy Bubeck 09/21/2016 7:47 PM

## 2016-09-21 NOTE — ED Triage Notes (Signed)
Pt c/o pain to left calf. Fell in winter and has had small hole in left lateral calf.  Started draining yellow fluid per pt Wednesday this week. No known fevers but chills last night.  No tylenol/motrin today. Left calf red and warm to touch.  No drainage now. Does have hole approximately 1 cm to left calf.

## 2016-09-22 ENCOUNTER — Inpatient Hospital Stay: Payer: Self-pay | Admitting: Anesthesiology

## 2016-09-22 ENCOUNTER — Encounter: Payer: Self-pay | Admitting: Anesthesiology

## 2016-09-22 ENCOUNTER — Encounter
Admission: EM | Disposition: A | Discharge status: Home Health | Payer: Self-pay | Source: Home / Self Care | Attending: Internal Medicine

## 2016-09-22 DIAGNOSIS — M7989 Other specified soft tissue disorders: Secondary | ICD-10-CM

## 2016-09-22 HISTORY — PX: INCISION AND DRAINAGE ABSCESS: SHX5864

## 2016-09-22 LAB — BASIC METABOLIC PANEL
ANION GAP: 10 (ref 5–15)
BUN: 20 mg/dL (ref 6–20)
CHLORIDE: 99 mmol/L — AB (ref 101–111)
CO2: 25 mmol/L (ref 22–32)
Calcium: 8.5 mg/dL — ABNORMAL LOW (ref 8.9–10.3)
Creatinine, Ser: 0.93 mg/dL (ref 0.61–1.24)
GFR calc non Af Amer: >60 mL/min (ref >60)
GLUCOSE: 103 mg/dL — AB (ref 65–99)
POTASSIUM: 3.9 mmol/L (ref 3.5–5.1)
Sodium: 134 mmol/L — ABNORMAL LOW (ref 135–145)

## 2016-09-22 LAB — CBC
HEMATOCRIT: 46.3 % (ref 40.0–52.0)
HEMOGLOBIN: 15.4 g/dL (ref 13.0–18.0)
MCH: 34 pg (ref 26.0–34.0)
MCHC: 33.3 g/dL (ref 32.0–36.0)
MCV: 102.1 fL — ABNORMAL HIGH (ref 80.0–100.0)
Platelets: 182 10*3/uL (ref 150–440)
RBC: 4.53 MIL/uL (ref 4.40–5.90)
RDW: 13.9 % (ref 11.5–14.5)
WBC: 12.7 10*3/uL — ABNORMAL HIGH (ref 3.8–10.6)

## 2016-09-22 SURGERY — INCISION AND DRAINAGE, ABSCESS
Anesthesia: General | Laterality: Left | Wound class: Dirty or Infected

## 2016-09-22 MED ORDER — EPHEDRINE SULFATE 50 MG/ML IJ SOLN
INTRAMUSCULAR | Status: DC | PRN
  Administered 2016-09-22 (×4): 5 mg via INTRAVENOUS

## 2016-09-22 MED ORDER — PROPOFOL 10 MG/ML IV BOLUS
INTRAVENOUS | Status: DC | PRN
  Administered 2016-09-22: 50 mg via INTRAVENOUS
  Administered 2016-09-22: 250 mg via INTRAVENOUS

## 2016-09-22 MED ORDER — SUCCINYLCHOLINE CHLORIDE 20 MG/ML IJ SOLN
INTRAMUSCULAR | Status: DC | PRN
  Administered 2016-09-22: 80 mg via INTRAVENOUS
  Administered 2016-09-22: 120 mg via INTRAVENOUS

## 2016-09-22 MED ORDER — PROPOFOL 10 MG/ML IV BOLUS
INTRAVENOUS | Status: AC
  Filled 2016-09-22: qty 20

## 2016-09-22 MED ORDER — FENTANYL CITRATE (PF) 100 MCG/2ML IJ SOLN
INTRAMUSCULAR | Status: DC | PRN
  Administered 2016-09-22 (×2): 50 ug via INTRAVENOUS

## 2016-09-22 MED ORDER — LIDOCAINE HCL (PF) 2 % IJ SOLN
INTRAMUSCULAR | Status: AC
  Filled 2016-09-22: qty 2

## 2016-09-22 MED ORDER — MIDAZOLAM HCL 2 MG/2ML IJ SOLN
INTRAMUSCULAR | Status: AC
  Filled 2016-09-22: qty 2

## 2016-09-22 MED ORDER — ONDANSETRON HCL 4 MG/2ML IJ SOLN
INTRAMUSCULAR | Status: AC
  Filled 2016-09-22: qty 2

## 2016-09-22 MED ORDER — MIDAZOLAM HCL 2 MG/2ML IJ SOLN
INTRAMUSCULAR | Status: DC | PRN
  Administered 2016-09-22: 2 mg via INTRAVENOUS

## 2016-09-22 MED ORDER — ALBUMIN HUMAN 25 % IV SOLN
25.0000 g | Freq: Once | INTRAVENOUS | Status: AC
  Administered 2016-09-22: 25 g via INTRAVENOUS
  Filled 2016-09-22: qty 100

## 2016-09-22 MED ORDER — CHLORHEXIDINE GLUCONATE CLOTH 2 % EX PADS
6.0000 | MEDICATED_PAD | Freq: Once | CUTANEOUS | Status: AC
  Administered 2016-09-22: 6 via TOPICAL

## 2016-09-22 MED ORDER — SUCCINYLCHOLINE CHLORIDE 20 MG/ML IJ SOLN
INTRAMUSCULAR | Status: AC
  Filled 2016-09-22: qty 1

## 2016-09-22 MED ORDER — LACTATED RINGERS IV SOLN
INTRAVENOUS | Status: DC
  Administered 2016-09-22: 1000 mL via INTRAVENOUS
  Administered 2016-09-22 – 2016-09-23 (×2): via INTRAVENOUS

## 2016-09-22 MED ORDER — FENTANYL CITRATE (PF) 100 MCG/2ML IJ SOLN: 25.0000 ug | INTRAMUSCULAR | Status: DC | PRN

## 2016-09-22 MED ORDER — PROMETHAZINE HCL 25 MG/ML IJ SOLN: 6.2500 mg | INTRAMUSCULAR | Status: DC | PRN

## 2016-09-22 MED ORDER — CHLORHEXIDINE GLUCONATE CLOTH 2 % EX PADS: 6.0000 | MEDICATED_PAD | Freq: Once | CUTANEOUS | Status: DC

## 2016-09-22 MED ORDER — DEXAMETHASONE SODIUM PHOSPHATE 10 MG/ML IJ SOLN
INTRAMUSCULAR | Status: DC | PRN
  Administered 2016-09-22: 10 mg via INTRAVENOUS

## 2016-09-22 MED ORDER — LIDOCAINE HCL (CARDIAC) 20 MG/ML IV SOLN
INTRAVENOUS | Status: DC | PRN
  Administered 2016-09-22: 100 mg via INTRAVENOUS

## 2016-09-22 MED ORDER — SODIUM CHLORIDE 0.9 % IV BOLUS (SEPSIS)
1000.0000 mL | Freq: Once | INTRAVENOUS | Status: AC
  Administered 2016-09-22: 1000 mL via INTRAVENOUS

## 2016-09-22 MED ORDER — PHENYLEPHRINE HCL 10 MG/ML IJ SOLN
INTRAMUSCULAR | Status: DC | PRN
  Administered 2016-09-22 (×4): 100 ug via INTRAVENOUS
  Administered 2016-09-22: 200 ug via INTRAVENOUS
  Administered 2016-09-22: 100 ug via INTRAVENOUS

## 2016-09-22 MED ORDER — DEXAMETHASONE SODIUM PHOSPHATE 10 MG/ML IJ SOLN
INTRAMUSCULAR | Status: AC
  Filled 2016-09-22: qty 1

## 2016-09-22 MED ORDER — FENTANYL CITRATE (PF) 100 MCG/2ML IJ SOLN
INTRAMUSCULAR | Status: AC
  Filled 2016-09-22: qty 2

## 2016-09-22 MED ORDER — ONDANSETRON HCL 4 MG/2ML IJ SOLN
INTRAMUSCULAR | Status: DC | PRN
  Administered 2016-09-22: 4 mg via INTRAVENOUS

## 2016-09-22 MED ORDER — LACTATED RINGERS IV SOLN
INTRAVENOUS | Status: DC | PRN
  Administered 2016-09-22: 15:00:00 via INTRAVENOUS

## 2016-09-22 SURGICAL SUPPLY — 23 items
ADHESIVE MASTISOL STRL (MISCELLANEOUS) ×3 IMPLANT
BLADE CLIPPER SURG (BLADE) ×3 IMPLANT
BLADE SURG 15 STRL LF DISP TIS (BLADE) ×1 IMPLANT
BLADE SURG 15 STRL SS (BLADE) ×2
CANISTER SUCT 1200ML W/VALVE (MISCELLANEOUS) ×3 IMPLANT
CANISTER SUCT 3000ML PPV (MISCELLANEOUS) IMPLANT
DRAPE EXTREMITY 106X87X128.5 (DRAPES) ×3 IMPLANT
DRAPE SHEET LG 3/4 BI-LAMINATE (DRAPES) ×3 IMPLANT
DRSG VAC ATS MED SENSATRAC (GAUZE/BANDAGES/DRESSINGS) ×3 IMPLANT
ELECT REM PT RETURN 9FT ADLT (ELECTROSURGICAL) ×3
ELECTRODE REM PT RTRN 9FT ADLT (ELECTROSURGICAL) ×1 IMPLANT
GLOVE BIO SURGEON STRL SZ7 (GLOVE) ×9 IMPLANT
GOWN STRL REUS W/ TWL LRG LVL3 (GOWN DISPOSABLE) ×2 IMPLANT
GOWN STRL REUS W/TWL LRG LVL3 (GOWN DISPOSABLE) ×4
NEEDLE HYPO 22GX1.5 SAFETY (NEEDLE) ×3 IMPLANT
NS IRRIG 1000ML POUR BTL (IV SOLUTION) ×3 IMPLANT
PACK BASIN MINOR ARMC (MISCELLANEOUS) ×3 IMPLANT
SCRUB POVIDONE IODINE 4 OZ (MISCELLANEOUS) ×3 IMPLANT
SOL PREP PVP 2OZ (MISCELLANEOUS) ×3
SOLUTION PREP PVP 2OZ (MISCELLANEOUS) ×1 IMPLANT
SPONGE LAP 18X18 5 PK (GAUZE/BANDAGES/DRESSINGS) ×3 IMPLANT
STOCKINETTE STRL 6IN 960660 (GAUZE/BANDAGES/DRESSINGS) ×3 IMPLANT
WND VAC CANISTER 500ML (MISCELLANEOUS) ×3 IMPLANT

## 2016-09-22 NOTE — Progress Notes (Signed)
[employer] - [place] at [facility]   PATIENT NAME: Jeffrey GarreDanny Alvarado    MR#:  [id number]  DATE OF BIRTH:  [birth year]  SUBJECTIVE:  CHIEF COMPLAINT:   Chief Complaint  Patient presents with  . leg wound    Has screw and plate in his left  Fibula, pain and recurrent infections in the left leg. Again came with redness swelling and some discharge with pain on his left leg. Seen by surgery and orthopedic hands CT scan is done which does not show the spread of the infection to his hardware.  REVIEW OF SYSTEMS:  CONSTITUTIONAL: No fever, fatigue or weakness.  EYES: No blurred or double vision.  EARS, NOSE, AND THROAT: No tinnitus or ear pain.  RESPIRATORY: No cough, shortness of breath, wheezing or hemoptysis.  CARDIOVASCULAR: No chest pain, orthopnea, edema.  GASTROINTESTINAL: No nausea, vomiting, diarrhea or abdominal pain.  GENITOURINARY: No dysuria, hematuria.  ENDOCRINE: No polyuria, nocturia,  HEMATOLOGY: No anemia, easy bruising or bleeding SKIN: No rash or lesion. MUSCULOSKELETAL: No joint pain or arthritis. Pain and redness on left leg.  NEUROLOGIC: No tingling, numbness, weakness.  PSYCHIATRY: No anxiety or depression.   ROS  DRUG ALLERGIES:  No Known Allergies  VITALS:  Blood pressure 117/76, pulse 80, temperature 98.2 F (36.8 C), temperature source Oral, resp. rate 20, height 5\' 11"  (1.803 m), weight 121.6 kg (268 lb), SpO2 99 %.  PHYSICAL EXAMINATION:  GENERAL:  56 y.o.-year-old patient lying in the bed with no acute distress.  EYES: Pupils equal, round, reactive to light and accommodation. No scleral icterus. Extraocular muscles intact.  HEENT: Head atraumatic, normocephalic. Oropharynx and nasopharynx clear.  NECK:  Supple, no jugular venous distention. No thyroid enlargement, no tenderness.  LUNGS: Normal breath sounds bilaterally, no wheezing, rales,rhonchi or crepitation. No use of accessory muscles of respiration.  CARDIOVASCULAR: S1, S2  normal. No murmurs, rubs, or gallops.  ABDOMEN: Soft, nontender, nondistended. Bowel sounds present. No organomegaly or mass.  EXTREMITIES: Left leg medial one third is red, swollen, tender, warm to touch and have actively draining purulent discharge from a small hole. NEUROLOGIC: Cranial nerves II through XII are intact. Muscle strength 5/5 in all extremities. Sensation intact. Gait not checked.  PSYCHIATRIC: The patient is alert and oriented x 3.  SKIN: No obvious rash, lesion, or ulcer.   Physical Exam LABORATORY PANEL:   CBC  Recent Labs Lab 09/22/16 0435  WBC 12.7*  HGB 15.4  HCT 46.3  PLT 182   ------------------------------------------------------------------------------------------------------------------  Chemistries   Recent Labs Lab 09/21/16 1453 09/22/16 0435  NA 135 134*  K 3.5 3.9  CL 102 99*  CO2 24 25  GLUCOSE 144* 103*  BUN 17 20  CREATININE 0.95 0.93  CALCIUM 8.9 8.5*  AST 18  --   ALT 16*  --   ALKPHOS 47  --   BILITOT 1.4*  --    ------------------------------------------------------------------------------------------------------------------  Cardiac Enzymes No results for input(s): TROPONINI in the last 168 hours. ------------------------------------------------------------------------------------------------------------------  RADIOLOGY:  Dg Tibia/fibula Left  Result Date: 09/21/2016 CLINICAL DATA:  Initial evaluation for left lower extremity swelling, cellulitis. EXAM: LEFT TIBIA AND FIBULA - 2 VIEW COMPARISON:  Prior radiograph from 05/09/2016. FINDINGS: Diffuse soft tissue swelling seen throughout the left lower leg, greatest laterally. Pocket of soft tissue emphysema present at the lateral aspect of the distal leg, measuring 5.2 cm in size. No dissecting soft tissue emphysema seen proximally. No radiopaque foreign body. Vascular calcifications noted. No acute osseus abnormality. No evidence  for osteomyelitis. Sequelae of prior ORIF seen at  the distal fibula. IMPRESSION: 1. Soft tissue swelling within the left leg, concerning for cellulitis/infection. Superimposed 5.2 cm pocket of soft tissue emphysema may reflect abscess with internal gas or possibly soft tissue wound with open communication to the overlying skin. No radiopaque foreign body. 2. No acute osseous abnormality. Electronically Signed   By: Rise Mu M.D.   On: 09/21/2016 17:08   Ct Tibia Fibula Left W Contrast  Result Date: 09/21/2016 CLINICAL DATA:  Left lower extremity erythema and swelling with pain. EXAM: CT OF THE LOWER LEFT EXTREMITY WITH CONTRAST TECHNIQUE: Multidetector CT imaging of the lower left extremity (femoral condyles through ankle) was performed according to the standard protocol following intravenous contrast administration. COMPARISON:  None. CONTRAST:  ISOVUE-300 IOPAMIDOL (ISOVUE-300) INJECTION 61% FINDINGS: Bones/Joint/Cartilage No acute fracture nor bone destruction. No dislocations. Metallic streak artifacts project over the distal fibular diaphysis consistent plate and screw fixation hardware. Ligaments Suboptimally assessed by CT. Muscles and Tendons Negative Soft tissues Soft tissue defect at the junction of the middle and distal third of the left leg laterally with subcutaneous emphysema deep to the defect. The subcutaneous air in seen tracking superficial to the extensor digitorum longus muscle along its mid and distal third. There appears be a small air-filled cavity deep to the cutaneous defect measuring 2.3 x 0.7 cm in craniocaudad by transverse dimension there may represent in the PACU weighted abscess cavity either through instrumentation or spontaneously. Although the constellation of these findings may be secondary to instrumentation and abscess drainage a soft tissue ulcer with possible fasciitis accounting for subcutaneous emphysema is not entirely exclude. No remaining focal fluid collections are identified to suggest the presence  of a drainable abscess. No intramuscular fluid collection or pyomyositis is identified. IMPRESSION: 1. Cellulitis along the lateral aspect of the left leg with more focal soft tissue defect at the junction of the middle and distal third. An approximately 2.3 x 0.7 cm subcutaneous air-filled cavity contiguous with the soft tissue defect is noted which may represent changes secondary to abscess drainage, either through instrumentation or spontaneously. The possibility of soft tissue ulcer with changes of a fasciitis are not entirely excluded given the scant amount of subcutaneous tracking air overlying the extensor digitorum longus muscle. 2. No acute osseous involvement. Plate and screw fixation of the distal fibular diaphysis. Electronically Signed   By: Tollie Eth M.D.   On: 09/21/2016 18:59   US Venous Img Lower Unilateral Left  Result Date: 09/21/2016 CLINICAL DATA:  Initial evaluation for acute lower extremity swelling, draining wound. EXAM: Left LOWER EXTREMITY VENOUS DOPPLER ULTRASOUND TECHNIQUE: Gray-scale sonography with graded compression, as well as color Doppler and duplex ultrasound were performed to evaluate the lower extremity deep venous systems from the level of the common femoral vein and including the common femoral, femoral, profunda femoral, popliteal and calf veins including the posterior tibial, peroneal and gastrocnemius veins when visible. The superficial great saphenous vein was also interrogated. Spectral Doppler was utilized to evaluate flow at rest and with distal augmentation maneuvers in the common femoral, femoral and popliteal veins. COMPARISON:  None. FINDINGS: Contralateral Common Femoral Vein: Respiratory phasicity is normal and symmetric with the symptomatic side. No evidence of thrombus. Normal compressibility. Common Femoral Vein: No evidence of thrombus. Normal compressibility, respiratory phasicity and response to augmentation. Saphenofemoral Junction: No evidence of  thrombus. Normal compressibility and flow on color Doppler imaging. Profunda Femoral Vein: No evidence of thrombus. Normal compressibility and  flow on color Doppler imaging. Femoral Vein: No evidence of thrombus. Normal compressibility, respiratory phasicity and response to augmentation. Popliteal Vein: No evidence of thrombus. Normal compressibility, respiratory phasicity and response to augmentation. Calf Veins: No evidence of thrombus. Normal compressibility and flow on color Doppler imaging. Superficial Great Saphenous Vein: No evidence of thrombus. Normal compressibility and flow on color Doppler imaging. Venous Reflux:  None. Other Findings: Mildly prominent left inguinal lymph node measures 1 cm in short axis but demonstrates a normal echogenic fatty hilum, may be reactive. IMPRESSION: No evidence of DVT within the left lower extremity. Electronically Signed   By: Rise Mu M.D.   On: 09/21/2016 17:34   [place] Misc Soft Tissue  Result Date: 09/21/2016 CLINICAL DATA:  Initial evaluation for soft tissue swelling, abscess of left leg. EXAM: ULTRASOUND left LOWER EXTREMITY LIMITED TECHNIQUE: Ultrasound examination of the lower extremity soft tissues was performed in the area of clinical concern. COMPARISON:  None. FINDINGS: Targeted ultrasound of the left lower extremity was performed at site of swelling and draining wound. Targeted ultrasound of the area of concern demonstrates diffuse soft tissue swelling and edema. Overlying skin defect measuring approximately 3 mm in size, consistent with draining tract. No discrete abscess or other drainable fluid collection identified. IMPRESSION: Diffuse soft tissue swelling and edema with associated draining track at area of concern in the left lower extremity, concerning for infection/cellulitis. No other discrete abscess or drainable fluid collection identified. Electronically Signed   By: Rise Mu M.D.   On: 09/21/2016 18:29    ASSESSMENT AND  PLAN:   Active Problems:   Cellulitis and abscess of left lower extremity  56 year old male with past medical history of obesity, obstructive sleep apnea, hypertension, history of previous coronary disease and status post stent placement, history of previous MI who presents to the hospital due to left lower extremity redness swelling and pain.  1. Left lower extremity cellulitis with abscess-just the cause of patient's above symptoms as stated. Left lower extremity Doppler was negative for DVT. - a contrast left lower extremity CT scan - confirms no spread to his hardware in leg,  need  incision and drainage. Planned for today by surgery. Appreciated help of surgery and ortho. -Start patient on broad-spectrum IV antibiotics vancomycin, Zosyn, follow blood, bleed cultures.  2. Leukocytosis-secondary to #1. Next-follow with IV antibiotic therapy.  3. Hyperlipidemia-continue Pravachol.  4. Essential hypertension-continue metoprolol, Seroquel.  5. Depression-continue Celexa.  6. History of previous coronary artery disease and stent placement-continue aspirin, statin, beta blocker and, ACE inhibitor.  All the records are reviewed and case discussed with Care Management/Social Workerr. Management plans discussed with the patient, family and they are in agreement.  CODE STATUS: full.  TOTAL TIME TAKING CARE OF THIS PATIENT: 35 minutes.     POSSIBLE D/C IN 1-2 DAYS, DEPENDING ON CLINICAL CONDITION.   Altamese Dilling M.D on 09/22/2016   Between 7am to 6pm - Pager - [phone]  After 6pm go to [email or web address] - password [email or web address]  [facility]  Office  [phone]  CC: Primary care physician; Evelene Croon, MD  Note: This dictation was prepared with Dragon dictation along with smaller phrase technology. Any transcriptional errors that result from this process are unintentional.

## 2016-09-22 NOTE — OR Nursing (Signed)
Patient from OR with new onset of a-fib cardiology consult ordered and 12 lead ekg

## 2016-09-22 NOTE — Anesthesia Procedure Notes (Signed)
Procedure Name: Intubation Date/Time: 09/22/2016 3:29 PM Performed by: Irving BurtonBACHICH, Avia Merkley Pre-anesthesia Checklist: Patient identified, Emergency Drugs available, Suction available and Patient being monitored Patient Re-evaluated:Patient Re-evaluated prior to induction Oxygen Delivery Method: Circle system utilized Preoxygenation: Pre-oxygenation with 100% oxygen Induction Type: IV induction Ventilation: Mask ventilation without difficulty Laryngoscope Size: McGraph and 4 Grade View: Grade II Tube type: Oral Tube size: 7.5 mm Number of attempts: 1 Airway Equipment and Method: Stylet and Video-laryngoscopy Placement Confirmation: ETT inserted through vocal cords under direct vision,  positive ETCO2 and breath sounds checked- equal and bilateral Secured at: 21 cm Tube secured with: Tape Dental Injury: Teeth and Oropharynx as per pre-operative assessment  Difficulty Due To: Difficulty was anticipated

## 2016-09-22 NOTE — Op Note (Signed)
  09/21/2016 - 09/22/2016  4:02 PM  PATIENT:  Jeffrey Alvarado  56 y.o. male  PRE-OPERATIVE DIAGNOSIS:  Left leg abscess  POST-OPERATIVE DIAGNOSIS:  Necrotizing soft tissue infection of the left leg  PROCEDURE:  1. Incision and drainage of complex leg abscess 2. Excisional debridement of skin subcutaneous tissue and muscle  measuring 108  square centimeters (12 x 9 x 1.cm depth)    SURGEON:  Surgeon(s) and Role:    * Pabon, Diego F, MD - Primary   ANESTHESIA: GETA  INDICATIONS FOR PROCEDURE  abscess  DICTATION:  Patient was playing about proceeding detail, risk benefits possible complications and a consent was obtained. The patient taken to the operating room and placed in the supine  position.  Lower extremity was prepped and draped in the usual sterile fashion. Incision was created and pus was drained and cultured. There was complex luculations that we were able to lyse with a combination of finger fracture and suction device. We also noticed some necrotic fat and had to extend significantly at our incision to be able to provide an adequate debridement. Using a 15 blade knife a generous elliptical incision was created to include subcutaneous tissue and skin. Using a sharp curette we debrided the sub q tissue down to the muscle to include fascia. Her significant undermining. The posterior opening approximately measure 9 x 3 cm but there was significant undermining on the posterior and anterior aspect. There was no evidence of tracking into the muscle but the fascia was densely involved and I was able to debride the necrotic fascia. We did not visualize any hardware or bone Hemostasis was obtained with electrocautery. Copious Irrigation with normal saline was used and small sponge was used and placed within the cavity. There was adequate seal and we will get back to the [facility] to 125 mm of pressure.  Needle and laparotomy counts were correct and there were no immediate  complications  Leafy Roiego F Pabon, MD

## 2016-09-22 NOTE — Transfer of Care (Signed)
Immediate Anesthesia Transfer of Care Note  Patient: Jeffrey Alvarado  Procedure(s) Performed: Procedure(s): INCISION AND DRAINAGE ABSCESS and application of wound vac (Left)  Patient Location: PACU  Anesthesia Type:General  Level of Consciousness: sedated  Airway & Oxygen Therapy: Patient connected to face mask oxygen  Post-op Assessment: Post -op Vital signs reviewed and stable  Post vital signs: stable  Last Vitals:  Vitals:   09/22/16 1233 09/22/16 1615  BP: 117/76 (!) 90/52  Pulse: 80 89  Resp:  18  Temp: 36.8 C 37.3 C  SpO2: 99% 93%    Last Pain:  Vitals:   09/22/16 1615  TempSrc: Temporal  PainSc:          Complications: No apparent anesthesia complications

## 2016-09-22 NOTE — Anesthesia Postprocedure Evaluation (Signed)
Anesthesia Post Note  Patient: Keionte R CartwSallee Langeright  Procedure(s) Performed: Procedure(s) (LRB): INCISION AND DRAINAGE ABSCESS and application of wound vac (Left)  Patient location during evaluation: PACU Anesthesia Type: General Level of consciousness: awake and alert Pain management: pain level controlled Vital Signs Assessment: post-procedure vital signs reviewed and stable Respiratory status: spontaneous breathing, nonlabored ventilation, respiratory function stable and patient connected to nasal cannula oxygen Cardiovascular status: stable Postop Assessment: no signs of nausea or vomiting Anesthetic complications: no Comments: Patient with new onset atrial fibrillation, verified with 12-lead EKG in PACU.  Consulted cardiology and called the hospitalist to make them aware of the situation.  Patient remains hemodynamically stable at this time.     Last Vitals:  Vitals:   09/22/16 1824 09/22/16 2117  BP: 133/88 127/80  Pulse: (!) 103 83  Resp: 18 20  Temp: 36.7 C 36.7 C  SpO2: 98% 96%    Last Pain:  Vitals:   09/22/16 2117  TempSrc: Oral  PainSc:                  Lenard SimmerAndrew Dalal Livengood

## 2016-09-22 NOTE — Anesthesia Preprocedure Evaluation (Signed)
Anesthesia Evaluation  Patient identified by MRN, date of birth, ID band Patient awake    Reviewed: Allergy & Precautions, H&P , NPO status , Patient's Chart, lab work & pertinent test results, reviewed documented beta blocker date and time   History of Anesthesia Complications Negative for: history of anesthetic complications  Airway Mallampati: I  TM Distance: >3 FB Neck ROM: full    Dental  (+) Missing, Dental Advidsory Given   Pulmonary neg shortness of breath, sleep apnea , neg COPD, neg recent URI, Current Smoker,           Cardiovascular Exercise Tolerance: Good hypertension, (-) angina+ CAD, + Past MI and + Cardiac Stents  (-) CABG (-) dysrhythmias (-) Valvular Problems/Murmurs     Neuro/Psych PSYCHIATRIC DISORDERS (Depression) negative neurological ROS     GI/Hepatic negative GI ROS, Neg liver ROS,   Endo/Other  negative endocrine ROS  Renal/GU negative Renal ROS  negative genitourinary   Musculoskeletal   Abdominal   Peds  Hematology negative hematology ROS (+)   Anesthesia Other Findings Past Medical History: 2009: Coronary artery disease     Comment:  s/p stent to RCA No date: Depression No date: Dyslipidemia No date: History of myocardial infarction No date: History of rectal bleeding No date: Hypertension No date: Obesity No date: Obesity No date: Obstructive sleep apnea   Reproductive/Obstetrics negative OB ROS                             Anesthesia Physical Anesthesia Plan  ASA: III  Anesthesia Plan: General   Post-op Pain Management:    Induction: Intravenous  PONV Risk Score and Plan: 1 and Ondansetron and Dexamethasone  Airway Management Planned: Oral ETT  Additional Equipment:   Intra-op Plan:   Post-operative Plan: Extubation in OR  Informed Consent: I have reviewed the patients History and Physical, chart, labs and discussed the procedure  including the risks, benefits and alternatives for the proposed anesthesia with the patient or authorized representative who has indicated his/her understanding and acceptance.   Dental Advisory Given  Plan Discussed with: Anesthesiologist, CRNA and Surgeon  Anesthesia Plan Comments:         Anesthesia Quick Evaluation

## 2016-09-22 NOTE — Progress Notes (Signed)
PT seen and examined. CT reviewed. Ortho does not believe this is a hardware infection HE continues to drain some cloudy and purulent material D/W the pt in detail. He is better serve with a debridement and formal I/D in the OR. D/W the pt in detail about the procedure, its benefits and possible complications. All questions were answered. We'll plan for I&D later today as the or schedule allows

## 2016-09-22 NOTE — Anesthesia Post-op Follow-up Note (Signed)
Anesthesia QCDR form completed.        

## 2016-09-23 ENCOUNTER — Inpatient Hospital Stay (HOSPITAL_COMMUNITY)
Admit: 2016-09-23 | Discharge: 2016-09-23 | Disposition: A | Discharge status: Home / Self Care | Payer: Self-pay | Attending: Internal Medicine | Admitting: Internal Medicine

## 2016-09-23 ENCOUNTER — Encounter: Payer: Self-pay | Admitting: Surgery

## 2016-09-23 DIAGNOSIS — I4891 Unspecified atrial fibrillation: Secondary | ICD-10-CM

## 2016-09-23 LAB — COMPREHENSIVE METABOLIC PANEL
ALBUMIN: 3.5 g/dL (ref 3.5–5.0)
ALK PHOS: 54 U/L (ref 38–126)
ALT: 23 U/L (ref 17–63)
AST: 24 U/L (ref 15–41)
Anion gap: 5 (ref 5–15)
BILIRUBIN TOTAL: 0.7 mg/dL (ref 0.3–1.2)
BUN: 17 mg/dL (ref 6–20)
CALCIUM: 8.2 mg/dL — AB (ref 8.9–10.3)
CO2: 25 mmol/L (ref 22–32)
CREATININE: 0.79 mg/dL (ref 0.61–1.24)
Chloride: 101 mmol/L (ref 101–111)
GFR calc Af Amer: >60 mL/min (ref >60)
GFR calc non Af Amer: >60 mL/min (ref >60)
GLUCOSE: 144 mg/dL — AB (ref 65–99)
Potassium: 4.6 mmol/L (ref 3.5–5.1)
SODIUM: 131 mmol/L — AB (ref 135–145)
Total Protein: 6.8 g/dL (ref 6.5–8.1)

## 2016-09-23 LAB — TSH: TSH: 0.543 u[IU]/mL (ref 0.350–4.500)

## 2016-09-23 LAB — ECHOCARDIOGRAM COMPLETE
HEIGHTINCHES: 71 in
WEIGHTICAEL: 4288 [oz_av]

## 2016-09-23 LAB — MAGNESIUM: MAGNESIUM: 2.2 mg/dL (ref 1.7–2.4)

## 2016-09-23 LAB — TROPONIN I: Troponin I: 0.06 ng/mL (ref <0.03)

## 2016-09-23 LAB — CBC
HEMATOCRIT: 42.3 % (ref 40.0–52.0)
HEMOGLOBIN: 14.2 g/dL (ref 13.0–18.0)
MCH: 34.2 pg — ABNORMAL HIGH (ref 26.0–34.0)
MCHC: 33.6 g/dL (ref 32.0–36.0)
MCV: 101.6 fL — ABNORMAL HIGH (ref 80.0–100.0)
Platelets: 173 10*3/uL (ref 150–440)
RBC: 4.16 MIL/uL — AB (ref 4.40–5.90)
RDW: 13.3 % (ref 11.5–14.5)
WBC: 12 10*3/uL — ABNORMAL HIGH (ref 3.8–10.6)

## 2016-09-23 LAB — VANCOMYCIN, TROUGH: Vancomycin Tr: 16 ug/mL (ref 15–20)

## 2016-09-23 MED ORDER — DEXTROSE-NACL 5-0.45 % IV SOLN
INTRAVENOUS | Status: DC
  Administered 2016-09-23: 1000 mL via INTRAVENOUS
  Administered 2016-09-24 (×2): via INTRAVENOUS

## 2016-09-23 MED ORDER — SODIUM CHLORIDE 0.9 % IV SOLN
1500.0000 mg | Freq: Three times a day (TID) | INTRAVENOUS | Status: DC
Start: 2016-09-23 — End: 2016-09-23

## 2016-09-23 MED ORDER — CHLORHEXIDINE GLUCONATE CLOTH 2 % EX PADS: 6.0000 | MEDICATED_PAD | Freq: Once | CUTANEOUS | Status: AC

## 2016-09-23 MED ORDER — MUPIROCIN 2 % EX OINT
1.0000 "application " | TOPICAL_OINTMENT | Freq: Two times a day (BID) | CUTANEOUS | Status: DC
  Administered 2016-09-23 – 2016-09-25 (×5): 1 via NASAL
  Filled 2016-09-23: qty 22

## 2016-09-23 MED ORDER — CHLORHEXIDINE GLUCONATE CLOTH 2 % EX PADS
6.0000 | MEDICATED_PAD | Freq: Every day | CUTANEOUS | Status: DC
  Administered 2016-09-24: 6 via TOPICAL

## 2016-09-23 MED ORDER — CHLORHEXIDINE GLUCONATE CLOTH 2 % EX PADS
6.0000 | MEDICATED_PAD | Freq: Once | CUTANEOUS | Status: AC
  Administered 2016-09-23: 6 via TOPICAL

## 2016-09-23 NOTE — Progress Notes (Signed)
SURGICAL PROGRESS NOTE  Hospital Day(s): 2.   Post op day(s): 1 Day Post-Op.   Interval History: Patient seen and examined, overnight bradycardia noted. Patient denies CP or SOB. Otherwise, patient reports mild well-controlled LLE peri-incisional pain, denies fever/chills.  Review of Systems:  Constitutional: denies fever, chills  HEENT: denies cough or congestion  Respiratory: denies any shortness of breath  Cardiovascular: denies chest pain or palpitations  Gastrointestinal: denies abdominal pain, N/V, or diarrhea Genitourinary: denies burning with urination or urinary frequency Musculoskeletal: FROM, ambulating without difficulty, LLE pain as per interval history Integumentary: denies any other rashes or skin discolorations except LLE post-surgical wound Neurological: denies HA or vision/hearing changes   Vital signs in last 24 hours: [min-max] current  Temp:  [97.6 F (36.4 C)-99.1 F (37.3 C)] 97.6 F (36.4 C) (08/13 0536) Pulse Rate:  [52-103] 52 (08/13 0536) Resp:  [16-20] 20 (08/13 0536) BP: (90-133)/(52-88) 115/81 (08/13 0536) SpO2:  [93 %-100 %] 100 % (08/13 0536) FiO2 (%):  [28 %] 28 % (08/12 1741)     Height: 5\' 11"  (180.3 cm) Weight: 268 lb (121.6 kg) BMI (Calculated): 37.5   Intake/Output this shift:  No intake/output data recorded.   Intake/Output last 2 shifts:  @IOLAST2SHIFTS @   Physical Exam:  Constitutional: alert, cooperative and no distress  HENT: normocephalic without obvious abnormality  Eyes: PERRL, EOM's grossly intact and symmetric  Neuro: CN II - XII grossly intact and symmetric without deficit  Respiratory: breathing non-labored at rest  Cardiovascular: regular rate and sinus rhythm  Gastrointestinal: soft, non-tender, and non-distended  Musculoskeletal: UE and LE FROM, no edema or wounds, motor and sensation grossly intact Integumentary: Left lateral leg wound with moderate tender erythema sourrounding intact VAC, non-purulent  serosanguinous drainage  Labs:  CBC Latest Ref Rng & Units 09/23/2016 09/22/2016 09/21/2016  WBC 3.8 - 10.6 K/uL 12.0(H) 12.7(H) 15.3(H)  Hemoglobin 13.0 - 18.0 g/dL 16.1 09.6 04.5  Hematocrit 40.0 - 52.0 % 42.3 46.3 48.5  Platelets 150 - 440 K/uL 173 182 214   CMP Latest Ref Rng & Units 09/23/2016 09/22/2016 09/21/2016  Glucose 65 - 99 mg/dL 409(W) 119(J) 478(G)  BUN 6 - 20 mg/dL 17 20 17   Creatinine 0.61 - 1.24 mg/dL 9.56 2.13 0.86  Sodium 135 - 145 mmol/L 131(L) 134(L) 135  Potassium 3.5 - 5.1 mmol/L 4.6 3.9 3.5  Chloride 101 - 111 mmol/L 101 99(L) 102  CO2 22 - 32 mmol/L 25 25 24   Calcium 8.9 - 10.3 mg/dL 8.2(L) 8.5(L) 8.9  Total Protein 6.5 - 8.1 g/dL 6.8 - 7.5  Total Bilirubin 0.3 - 1.2 mg/dL 0.7 - 1.4(H)  Alkaline Phos 38 - 126 U/L 54 - 47  AST 15 - 41 U/L 24 - 18  ALT 17 - 63 U/L 23 - 16(L)   Imaging studies: No new pertinent imaging studies   Assessment/Plan: (ICD-10's: M72.6) 56 y.o. male with new onset/diagnosed atrial fibrillation 1 Day Post-Op s/p sharp excisional debridement of 12 x 9 x 1 cm subcutaneous tissues including muscle as well as incision and drainage of complex Left leg abscess for Left leg necrotizing soft tissue infection, complicated by pertinent comorbidities including morbid obesity (BMI 38), HTN, HLD, CAD s/p PCI (2009) for MI, OSA, surgical placement of LLE ankle hardware (does not appear to be involved with LLE infection on CT imaging), and major depression disorder.   - pain control prn  - continue therapeutic antibiotics  - follow up pending deep tissue wound cultures  - return to  OR tomorrow for reassessment, debridement, and VAC change  - medical management of comorbidities per primary medical team  - likely okay for discharge 8/15 from surgical perspective  All of the above findings and recommendations were discussed with the patient and patient's medical physician, and patient's RN, and all of patient's questions were answered to his expressed  satisfaction.  Thank you for the opportunity to participate in this patient's care.  -- Scherrie GerlachJason E. Earlene Plateravis, MD, RPVI [place]: [facility] - Partnering for exceptional care. Office: [phone]

## 2016-09-23 NOTE — Progress Notes (Addendum)
[employer] - [place] [facility]   PATIENT NAME: Jeffrey Alvarado    MR#:  [id number]  DATE OF BIRTH:  1960/06/23  SUBJECTIVE:  CHIEF COMPLAINT:   Chief Complaint  Patient presents with  . leg wound    Has screw and plate in his left  Fibula, pain and recurrent infections in the left leg. Again came with redness swelling and some discharge with pain on his left leg. Seen by surgery and orthopedic hands CT scan is done which does not show the spread of the infection to his hardware. S/p debridement 09/22/16 by surgery. Have wound vac.  REVIEW OF SYSTEMS:  CONSTITUTIONAL: No fever, fatigue or weakness.  EYES: No blurred or double vision.  EARS, NOSE, AND THROAT: No tinnitus or ear pain.  RESPIRATORY: No cough, shortness of breath, wheezing or hemoptysis.  CARDIOVASCULAR: No chest pain, orthopnea, edema.  GASTROINTESTINAL: No nausea, vomiting, diarrhea or abdominal pain.  GENITOURINARY: No dysuria, hematuria.  ENDOCRINE: No polyuria, nocturia,  HEMATOLOGY: No anemia, easy bruising or bleeding SKIN: No rash or lesion. MUSCULOSKELETAL: No joint pain or arthritis. Pain and redness on left leg.  NEUROLOGIC: No tingling, numbness, weakness.  PSYCHIATRY: No anxiety or depression.   ROS  DRUG ALLERGIES:  No Known Allergies  VITALS:  Blood pressure 115/81, pulse (!) 52, temperature 97.6 F (36.4 C), temperature source Oral, resp. rate 20, height 5\' 11"  (1.803 m), weight 121.6 kg (268 lb), SpO2 100 %.  PHYSICAL EXAMINATION:  GENERAL:  56 y.o.-year-old patient lying in the bed with no acute distress.  EYES: Pupils equal, round, reactive to light and accommodation. No scleral icterus. Extraocular muscles intact.  HEENT: Head atraumatic, normocephalic. Oropharynx and nasopharynx clear.  NECK:  Supple, no jugular venous distention. No thyroid enlargement, no tenderness.  LUNGS: Normal breath sounds bilaterally, no wheezing, rales,rhonchi or crepitation. No use of  accessory muscles of respiration.  CARDIOVASCULAR: S1, S2 normal. No murmurs, rubs, or gallops.  ABDOMEN: Soft, nontender, nondistended. Bowel sounds present. No organomegaly or mass.  EXTREMITIES: Left leg medial one third is red, swollen, tender, warm to touch and woundvac in place. NEUROLOGIC: Cranial nerves II through XII are intact. Muscle strength 5/5 in all extremities. Sensation intact. Gait not checked.  PSYCHIATRIC: The patient is alert and oriented x 3.  SKIN: No obvious rash, lesion, or ulcer.   Physical Exam LABORATORY PANEL:   CBC  Recent Labs Lab 09/23/16 0512  WBC 12.0*  HGB 14.2  HCT 42.3  PLT 173   ------------------------------------------------------------------------------------------------------------------  Chemistries   Recent Labs Lab 09/23/16 0512  NA 131*  K 4.6  CL 101  CO2 25  GLUCOSE 144*  BUN 17  CREATININE 0.79  CALCIUM 8.2*  MG 2.2  AST 24  ALT 23  ALKPHOS 54  BILITOT 0.7   ------------------------------------------------------------------------------------------------------------------  Cardiac Enzymes  Recent Labs Lab 09/23/16 0512  TROPONINI 0.06*   ------------------------------------------------------------------------------------------------------------------  RADIOLOGY:  Dg Tibia/fibula Left  Result Date: 09/21/2016 CLINICAL DATA:  Initial evaluation for left lower extremity swelling, cellulitis. EXAM: LEFT TIBIA AND FIBULA - 2 VIEW COMPARISON:  Prior radiograph from 05/09/2016. FINDINGS: Diffuse soft tissue swelling seen throughout the left lower leg, greatest laterally. Pocket of soft tissue emphysema present at the lateral aspect of the distal leg, measuring 5.2 cm in size. No dissecting soft tissue emphysema seen proximally. No radiopaque foreign body. Vascular calcifications noted. No acute osseus abnormality. No evidence for osteomyelitis. Sequelae of prior ORIF seen at the distal fibula. IMPRESSION: 1. Soft tissue  swelling within the left leg, concerning for cellulitis/infection. Superimposed 5.2 cm pocket of soft tissue emphysema may reflect abscess with internal gas or possibly soft tissue wound with open communication to the overlying skin. No radiopaque foreign body. 2. No acute osseous abnormality. Electronically Signed   By: Rise MuBenjamin  McClintock M.D.   On: 09/21/2016 17:08   Ct Tibia Fibula Left W Contrast  Result Date: 09/21/2016 CLINICAL DATA:  Left lower extremity erythema and swelling with pain. EXAM: CT OF THE LOWER LEFT EXTREMITY WITH CONTRAST TECHNIQUE: Multidetector CT imaging of the lower left extremity (femoral condyles through ankle) was performed according to the standard protocol following intravenous contrast administration. COMPARISON:  None. CONTRAST:  100mL ISOVUE-300 IOPAMIDOL (ISOVUE-300) INJECTION 61% FINDINGS: Bones/Joint/Cartilage No acute fracture nor bone destruction. No dislocations. Metallic streak artifacts project over the distal fibular diaphysis consistent plate and screw fixation hardware. Ligaments Suboptimally assessed by CT. Muscles and Tendons Negative Soft tissues Soft tissue defect at the junction of the middle and distal third of the left leg laterally with subcutaneous emphysema deep to the defect. The subcutaneous air in seen tracking superficial to the extensor digitorum longus muscle along its mid and distal third. There appears be a small air-filled cavity deep to the cutaneous defect measuring 2.3 x 0.7 cm in craniocaudad by transverse dimension there may represent in the PACU weighted abscess cavity either through instrumentation or spontaneously. Although the constellation of these findings may be secondary to instrumentation and abscess drainage a soft tissue ulcer with possible fasciitis accounting for subcutaneous emphysema is not entirely exclude. No remaining focal fluid collections are identified to suggest the presence of a drainable abscess. No intramuscular  fluid collection or pyomyositis is identified. IMPRESSION: 1. Cellulitis along the lateral aspect of the left leg with more focal soft tissue defect at the junction of the middle and distal third. An approximately 2.3 x 0.7 cm subcutaneous air-filled cavity contiguous with the soft tissue defect is noted which may represent changes secondary to abscess drainage, either through instrumentation or spontaneously. The possibility of soft tissue ulcer with changes of a fasciitis are not entirely excluded given the scant amount of subcutaneous tracking air overlying the extensor digitorum longus muscle. 2. No acute osseous involvement. Plate and screw fixation of the distal fibular diaphysis. Electronically Signed   By: Tollie Ethavid  Kwon M.D.   On: 09/21/2016 18:59   [place] Venous Img Lower Unilateral Left  Result Date: 09/21/2016 CLINICAL DATA:  Initial evaluation for acute lower extremity swelling, draining wound. EXAM: Left LOWER EXTREMITY VENOUS DOPPLER ULTRASOUND TECHNIQUE: Gray-scale sonography with graded compression, as well as color Doppler and duplex ultrasound were performed to evaluate the lower extremity deep venous systems from the level of the common femoral vein and including the common femoral, femoral, profunda femoral, popliteal and calf veins including the posterior tibial, peroneal and gastrocnemius veins when visible. The superficial great saphenous vein was also interrogated. Spectral Doppler was utilized to evaluate flow at rest and with distal augmentation maneuvers in the common femoral, femoral and popliteal veins. COMPARISON:  None. FINDINGS: Contralateral Common Femoral Vein: Respiratory phasicity is normal and symmetric with the symptomatic side. No evidence of thrombus. Normal compressibility. Common Femoral Vein: No evidence of thrombus. Normal compressibility, respiratory phasicity and response to augmentation. Saphenofemoral Junction: No evidence of thrombus. Normal compressibility and flow on  color Doppler imaging. Profunda Femoral Vein: No evidence of thrombus. Normal compressibility and flow on color Doppler imaging. Femoral Vein: No evidence of thrombus. Normal compressibility, respiratory phasicity and  response to augmentation. Popliteal Vein: No evidence of thrombus. Normal compressibility, respiratory phasicity and response to augmentation. Calf Veins: No evidence of thrombus. Normal compressibility and flow on color Doppler imaging. Superficial Great Saphenous Vein: No evidence of thrombus. Normal compressibility and flow on color Doppler imaging. Venous Reflux:  None. Other Findings: Mildly prominent left inguinal lymph node measures 1 cm in short axis but demonstrates a normal echogenic fatty hilum, may be reactive. IMPRESSION: No evidence of DVT within the left lower extremity. Electronically Signed   By: Rise Mu M.D.   On: 09/21/2016 17:34   [place] Misc Soft Tissue  Result Date: 09/21/2016 CLINICAL DATA:  Initial evaluation for soft tissue swelling, abscess of left leg. EXAM: ULTRASOUND left LOWER EXTREMITY LIMITED TECHNIQUE: Ultrasound examination of the lower extremity soft tissues was performed in the area of clinical concern. COMPARISON:  None. FINDINGS: Targeted ultrasound of the left lower extremity was performed at site of swelling and draining wound. Targeted ultrasound of the area of concern demonstrates diffuse soft tissue swelling and edema. Overlying skin defect measuring approximately 3 mm in size, consistent with draining tract. No discrete abscess or other drainable fluid collection identified. IMPRESSION: Diffuse soft tissue swelling and edema with associated draining track at area of concern in the left lower extremity, concerning for infection/cellulitis. No other discrete abscess or drainable fluid collection identified. Electronically Signed   By: Rise Mu M.D.   On: 09/21/2016 18:29    ASSESSMENT AND PLAN:   Active Problems:   Cellulitis and  abscess of left lower extremity   Necrotizing soft tissue infection  56 year old male with past medical history of obesity, obstructive sleep apnea, hypertension, history of previous coronary disease and status post stent placement, history of previous MI who presents to the hospital due to left lower extremity redness swelling and pain.  * Left lower extremity cellulitis with abscess-just the cause of patient's above symptoms as stated. Left lower extremity Doppler was negative for DVT. - a contrast left lower extremity CT scan - confirms no spread to his hardware in leg,   Appreciated help of surgery and ortho. - patient on broad-spectrum IV antibiotics vancomycin, Zosyn, follow blood, bleed cultures. - s/p I & D 09/22/16, have wound vac, cx sent- will wait for result for Abx. - as per surgery- revision surgery tomorrow for further debridement.  * New onset A fib   Started on cardiac monitor, troponin checked.    Echo ordered.   Was on metoprolol, but have a fib with slow heart rat and pauses, so stopepd metoprolol.   Awaited cardio consult.\  * Leukocytosis-secondary to #1. Next-follow with IV antibiotic therapy.  * Hyperlipidemia-continue Pravachol.  * Essential hypertension-continue metoprolol, Seroquel.  * Depression-continue Celexa.  * History of previous coronary artery disease and stent placement-continue aspirin, statin,, ACE inhibitor.    Held beta blocker.  All the records are reviewed and case discussed with Care Management/Social Workerr. Management plans discussed with the patient, family and they are in agreement.  CODE STATUS: full.  TOTAL TIME TAKING CARE OF THIS PATIENT: 35 minutes.    POSSIBLE D/C IN 2-3 DAYS, DEPENDING ON CLINICAL CONDITION.   Altamese Dilling M.D on 09/23/2016   Between 7am to 6pm - Pager - [phone]  After 6pm go to [email or web address] - password [email or web address]  [facility]  Office  [phone]  CC: Primary  care physician; Evelene Croon, MD  Note: This dictation was prepared with Dragon dictation along with smaller phrase technology. Any transcriptional  errors that result from this process are unintentional.

## 2016-09-23 NOTE — Progress Notes (Signed)
Pharmacy Antibiotic Note  Jeffrey Alvarado is a 56 y.o. male admitted on 09/21/2016 with cellulitis.  Pharmacy has been consulted for vancomycin and zosyn dosing.  Plan: Vancomycin 2000mg  IV every 12 hours.  Goal trough 10-15 mcg/mL.  Zosyn 3.375gm iv q8h for cellulitis  Vancomycin trough level 8/13@1330   8/13 1325 vancomycin trough 16 mcg/mL. Continue current regimen. Pharmacy will continue to follow and adjust as needed to maintain trough 15 to 20 mcg/mL (cellulitis with abscess).  Height: 5\' 11"  (180.3 cm) Weight: 268 lb (121.6 kg) IBW/kg (Calculated) : 75.3  Temp (24hrs), Avg:98.2 F (36.8 C), Min:97.6 F (36.4 C), Max:99.1 F (37.3 C)   Recent Labs Lab 09/21/16 1453 09/22/16 0435 09/23/16 0512 09/23/16 1325  WBC 15.3* 12.7* 12.0*  --   CREATININE 0.95 0.93 0.79  --   VANCOTROUGH  --   --   --  16    Estimated Creatinine Clearance: 141.7 mL/min (by C-G formula based on SCr of 0.79 mg/dL).    No Known Allergies  Antimicrobials this admission: 8/11 zosyn 3.375gm iv q8h >>  8/11 vancomycin 2gm iv q12h >>    Microbiology results: 8/11 Wound gram positive cocci  Thank you for allowing pharmacy to be a part of this patient's care.  Carola FrostNathan A Srishti Strnad, Pharm.D., BCPS Clinical Pharmacist 09/23/2016 2:36 PM

## 2016-09-23 NOTE — Progress Notes (Signed)
*  PRELIMINARY RESULTS* Echocardiogram 2D Echocardiogram has been performed.  Cristela BlueHege, Geovani Tootle 09/23/2016, 10:34 AM

## 2016-09-23 NOTE — Progress Notes (Signed)
Patient HR nonsustained as low as 39 BPM per monitor. Sustained HR in the 60s. Dr. Sheryle Hailiamond made aware earlier when bradycardia started with a repeat call at this time d/t HR of 35. Will continue to monitor.

## 2016-09-23 NOTE — Consult Note (Signed)
WOC Nurse wound consult note Reason for Consult:Wound VAC to left lateral leg with cellulitis.  S/P Excisional debridement. Wound type: Infectious Pressure Injury POA: NA Wound VAC was placed in OR yesterday.  Surgical service is covering at this time.  Please re-consult if needed.  Will not follow at this time.  Please re-consult if needed.  Maple HudsonKaren Anadia Helmes RN BSN CWON Pager [phone]

## 2016-09-24 ENCOUNTER — Inpatient Hospital Stay: Payer: Self-pay | Admitting: Anesthesiology

## 2016-09-24 ENCOUNTER — Encounter
Admission: EM | Disposition: A | Discharge status: Home Health | Payer: Self-pay | Source: Home / Self Care | Attending: Internal Medicine

## 2016-09-24 ENCOUNTER — Encounter: Payer: Self-pay | Admitting: *Deleted

## 2016-09-24 HISTORY — PX: APPLICATION OF WOUND VAC: SHX5189

## 2016-09-24 HISTORY — PX: WOUND DEBRIDEMENT: SHX247

## 2016-09-24 LAB — SURGICAL PATHOLOGY

## 2016-09-24 LAB — AEROBIC CULTURE W GRAM STAIN (SUPERFICIAL SPECIMEN): Special Requests: NORMAL

## 2016-09-24 LAB — BASIC METABOLIC PANEL
Anion gap: 6 (ref 5–15)
BUN: 14 mg/dL (ref 6–20)
CALCIUM: 8.7 mg/dL — AB (ref 8.9–10.3)
CO2: 27 mmol/L (ref 22–32)
CREATININE: 0.72 mg/dL (ref 0.61–1.24)
Chloride: 105 mmol/L (ref 101–111)
GFR calc Af Amer: >60 mL/min (ref >60)
GFR calc non Af Amer: >60 mL/min (ref >60)
GLUCOSE: 123 mg/dL — AB (ref 65–99)
Potassium: 4.1 mmol/L (ref 3.5–5.1)
Sodium: 138 mmol/L (ref 135–145)

## 2016-09-24 LAB — CBC
HEMATOCRIT: 42.6 % (ref 40.0–52.0)
Hemoglobin: 14.2 g/dL (ref 13.0–18.0)
MCH: 34 pg (ref 26.0–34.0)
MCHC: 33.3 g/dL (ref 32.0–36.0)
MCV: 101.8 fL — AB (ref 80.0–100.0)
Platelets: 204 10*3/uL (ref 150–440)
RBC: 4.18 MIL/uL — ABNORMAL LOW (ref 4.40–5.90)
RDW: 13.8 % (ref 11.5–14.5)
WBC: 11.3 10*3/uL — ABNORMAL HIGH (ref 3.8–10.6)

## 2016-09-24 SURGERY — DEBRIDEMENT, WOUND
Anesthesia: General | Laterality: Left | Wound class: Dirty or Infected

## 2016-09-24 MED ORDER — BUPIVACAINE HCL (PF) 0.5 % IJ SOLN
INTRAMUSCULAR | Status: AC
  Filled 2016-09-24: qty 30

## 2016-09-24 MED ORDER — LIDOCAINE HCL (PF) 1 % IJ SOLN
INTRAMUSCULAR | Status: AC
  Filled 2016-09-24: qty 30

## 2016-09-24 MED ORDER — ONDANSETRON HCL 4 MG/2ML IJ SOLN
INTRAMUSCULAR | Status: DC | PRN
  Administered 2016-09-24: 4 mg via INTRAVENOUS

## 2016-09-24 MED ORDER — FENTANYL CITRATE (PF) 100 MCG/2ML IJ SOLN
INTRAMUSCULAR | Status: DC | PRN
  Administered 2016-09-24 (×2): 50 ug via INTRAVENOUS

## 2016-09-24 MED ORDER — ONDANSETRON HCL 4 MG/2ML IJ SOLN
INTRAMUSCULAR | Status: AC
  Filled 2016-09-24: qty 2

## 2016-09-24 MED ORDER — MIDAZOLAM HCL 2 MG/2ML IJ SOLN
INTRAMUSCULAR | Status: AC
  Filled 2016-09-24: qty 2

## 2016-09-24 MED ORDER — FENTANYL CITRATE (PF) 100 MCG/2ML IJ SOLN
INTRAMUSCULAR | Status: AC
  Filled 2016-09-24: qty 2

## 2016-09-24 MED ORDER — FENTANYL CITRATE (PF) 100 MCG/2ML IJ SOLN
25.0000 ug | INTRAMUSCULAR | Status: DC | PRN
  Administered 2016-09-24 (×4): 25 ug via INTRAVENOUS

## 2016-09-24 MED ORDER — DEXTROSE 5 % IV SOLN
2.0000 g | Freq: Two times a day (BID) | INTRAVENOUS | Status: DC
  Administered 2016-09-24 – 2016-09-25 (×2): 2 g via INTRAVENOUS
  Filled 2016-09-24 (×8): qty 2

## 2016-09-24 MED ORDER — FENTANYL CITRATE (PF) 100 MCG/2ML IJ SOLN
INTRAMUSCULAR | Status: AC
  Administered 2016-09-24: 25 ug via INTRAVENOUS
  Filled 2016-09-24: qty 2

## 2016-09-24 MED ORDER — HYDRALAZINE HCL 20 MG/ML IJ SOLN
10.0000 mg | Freq: Four times a day (QID) | INTRAMUSCULAR | Status: DC | PRN
  Administered 2016-09-24: 10 mg via INTRAVENOUS
  Filled 2016-09-24 (×2): qty 1

## 2016-09-24 MED ORDER — PROPOFOL 10 MG/ML IV BOLUS
INTRAVENOUS | Status: AC
  Filled 2016-09-24: qty 20

## 2016-09-24 MED ORDER — ONDANSETRON HCL 4 MG/2ML IJ SOLN: 4.0000 mg | Freq: Once | INTRAMUSCULAR | Status: DC | PRN

## 2016-09-24 MED ORDER — ENOXAPARIN SODIUM 40 MG/0.4ML ~~LOC~~ SOLN
40.0000 mg | SUBCUTANEOUS | Status: DC
  Administered 2016-09-25: 40 mg via SUBCUTANEOUS
  Filled 2016-09-24: qty 0.4

## 2016-09-24 MED ORDER — PROPOFOL 500 MG/50ML IV EMUL
INTRAVENOUS | Status: DC | PRN
  Administered 2016-09-24: 75 ug/kg/min via INTRAVENOUS

## 2016-09-24 MED ORDER — PROPOFOL 500 MG/50ML IV EMUL
INTRAVENOUS | Status: AC
  Filled 2016-09-24: qty 50

## 2016-09-24 MED ORDER — MIDAZOLAM HCL 2 MG/2ML IJ SOLN
INTRAMUSCULAR | Status: DC | PRN
  Administered 2016-09-24: 2 mg via INTRAVENOUS

## 2016-09-24 MED ORDER — LACTATED RINGERS IV SOLN
INTRAVENOUS | Status: DC
  Administered 2016-09-24 – 2016-09-25 (×3): via INTRAVENOUS

## 2016-09-24 MED ORDER — PROPOFOL 10 MG/ML IV BOLUS
INTRAVENOUS | Status: DC | PRN
  Administered 2016-09-24 (×2): 50 mg via INTRAVENOUS

## 2016-09-24 MED ORDER — ENALAPRILAT 1.25 MG/ML IV SOLN
1.2500 mg | Freq: Once | INTRAVENOUS | Status: DC
  Filled 2016-09-24: qty 1

## 2016-09-24 MED ORDER — LIDOCAINE HCL (PF) 2 % IJ SOLN
INTRAMUSCULAR | Status: AC
  Filled 2016-09-24: qty 2

## 2016-09-24 SURGICAL SUPPLY — 33 items
BAG HAMPER (MISCELLANEOUS) ×3 IMPLANT
BNDG GAUZE 4.5X4.1 6PLY STRL (MISCELLANEOUS) IMPLANT
CANISTER SUCT 1200ML W/VALVE (MISCELLANEOUS) ×3 IMPLANT
COVER LIGHT HANDLE STERIS (MISCELLANEOUS) ×3 IMPLANT
DRAPE LAPAROTOMY T 102X78X121 (DRAPES) ×3 IMPLANT
DRSG VAC ATS MED SENSATRAC (GAUZE/BANDAGES/DRESSINGS) ×3 IMPLANT
ELECT REM PT RETURN 9FT ADLT (ELECTROSURGICAL) ×3
ELECTRODE REM PT RTRN 9FT ADLT (ELECTROSURGICAL) ×1 IMPLANT
GAUZE SPONGE 4X4 12PLY STRL (GAUZE/BANDAGES/DRESSINGS) IMPLANT
GLOVE BIO SURGEON STRL SZ7.5 (GLOVE) ×6 IMPLANT
GLOVE BIOGEL PI IND STRL 7.0 (GLOVE) ×3 IMPLANT
GLOVE BIOGEL PI IND STRL 7.5 (GLOVE) ×3 IMPLANT
GLOVE BIOGEL PI INDICATOR 7.0 (GLOVE) ×6
GLOVE BIOGEL PI INDICATOR 7.5 (GLOVE) ×6
GLOVE ECLIPSE 7.0 STRL STRAW (GLOVE) ×3 IMPLANT
GOWN STRL REUS W/ TWL LRG LVL3 (GOWN DISPOSABLE) ×2 IMPLANT
GOWN STRL REUS W/TWL LRG LVL3 (GOWN DISPOSABLE) ×10 IMPLANT
IV NS IRRIG 3000ML ARTHROMATIC (IV SOLUTION) IMPLANT
KIT RM TURNOVER STRD PROC AR (KITS) ×3 IMPLANT
MANIFOLD NEPTUNE II (INSTRUMENTS) IMPLANT
NEEDLE HYPO 25X1 1.5 SAFETY (NEEDLE) ×3 IMPLANT
NS IRRIG 1000ML POUR BTL (IV SOLUTION) ×3 IMPLANT
OINTMENT BETADINE 1.5GM (MISCELLANEOUS) ×3 IMPLANT
PACK ABDOMINAL MINOR (CUSTOM PROCEDURE TRAY) ×3 IMPLANT
PACK BASIN MINOR ARMC (MISCELLANEOUS) ×3 IMPLANT
PAD ARMBOARD 7.5X6 YLW CONV (MISCELLANEOUS) ×3 IMPLANT
SOL PREP PVP 2OZ (MISCELLANEOUS) ×3
SOLUTION PREP PVP 2OZ (MISCELLANEOUS) ×1 IMPLANT
SPONGE ABDOMINAL VAC ABTHERA (MISCELLANEOUS) IMPLANT
SPONGE LAP 18X18 5 PK (GAUZE/BANDAGES/DRESSINGS) IMPLANT
SWABSTK COMLB BENZOIN TINCTURE (MISCELLANEOUS) ×3 IMPLANT
SYR BULB IRRIG 60ML STRL (SYRINGE) ×3 IMPLANT
WND VAC CANISTER 500ML (MISCELLANEOUS) ×3 IMPLANT

## 2016-09-24 NOTE — Anesthesia Procedure Notes (Addendum)
Performed by: Casey BurkittHOANG, Jonathen Rathman Pre-anesthesia Checklist: Patient identified, Emergency Drugs available, Suction available, Patient being monitored and Timeout performed Patient Re-evaluated:Patient Re-evaluated prior to induction Oxygen Delivery Method: Simple face mask Preoxygenation: Pre-oxygenation with 100% oxygen Induction Type: IV induction Ventilation: Nasal airway inserted- appropriate to patient size

## 2016-09-24 NOTE — Progress Notes (Signed)
Patient heart rate decreasing to low 30s several times. Patient asymptomatic. Sleeping. Md notified. Acknowledged. Patient had 5 beat run of Vtach. Asymptomatic. MD notified. Will monitor

## 2016-09-24 NOTE — Consult Note (Signed)
Asked to see patient for preoperative assessment prior to surgery Patient has history of atrial fibrillation paroxysmal obesity known coronary disease preoperative surgery EKG nondiagnostic slightly abnormal Patient asymptomatic at the time of the exam Hemodynamically stable Status post surgery on his leg for abscess Lung exam" status and percussion Heart exam regular rate and rhythm systolic ejection murmur left sternal border Abdominal exam is benign Extremities exam with open wound with drainage catheter and left leg . Impression Preop for surgery Left leg abscess MRSA Obesity Known coronary disease Hypertension . Plan Patient appears to be an acceptable surgical risk Continue blood pressure control and support Continue broad-spectrum antibiotic therapy Continue telemetry Recommend follow-up with cardiology upon discharge Do not recommend any intervention to modify his risk at this point

## 2016-09-24 NOTE — Progress Notes (Signed)
Dr. Juliann Paresallwood was notified of Braycardic,V-tach events that occurred last night. He acknowleged and will write order for elevated BP as well.

## 2016-09-24 NOTE — Progress Notes (Signed)
Still waiting for Dr. Juliann Paresallwood to return a call.

## 2016-09-24 NOTE — Anesthesia Postprocedure Evaluation (Signed)
Anesthesia Post Note  Patient: Sallee LangeDanny R Behrendt  Procedure(s) Performed: Procedure(s) (LRB): DEBRIDEMENT WOUND (Left) APPLICATION OF WOUND VAC (Left)  Patient location during evaluation: PACU Anesthesia Type: General Level of consciousness: awake and alert and oriented Pain management: pain level controlled Vital Signs Assessment: post-procedure vital signs reviewed and stable Respiratory status: spontaneous breathing, nonlabored ventilation and respiratory function stable Cardiovascular status: blood pressure returned to baseline and stable Postop Assessment: no signs of nausea or vomiting Anesthetic complications: no     Last Vitals:  Vitals:   09/24/16 1610 09/24/16 1625  BP: (!) 159/102 (!) 143/105  Pulse: 76 75  Resp: 15 14  Temp: 36.4 C   SpO2: 98% 96%    Last Pain:  Vitals:   09/24/16 1625  TempSrc:   PainSc: 2                  Kristalyn Bergstresser

## 2016-09-24 NOTE — Progress Notes (Signed)
MD was notified of elevated BP. Order received. Will follow up on cardiology consult.

## 2016-09-24 NOTE — Op Note (Signed)
SURGICAL OPERATIVE REPORT  DATE OF PROCEDURE: 09/24/2016  ATTENDING Surgeon(s): Ancil Linseyavis, Jason Evan, MD  ASSISTANT(S): Theodoro KosMark Benning, PA-S  ANESTHESIA: IV sedation   PRE-OPERATIVE DIAGNOSIS: 14 cm x 10 cm deep wound with 9 cm x 4.5 cm superficial (skin) opening, attributed to necrotizing soft tissue infection (icd-10's: M72.6)  POST-OPERATIVE DIAGNOSIS: 14 cm x 10 cm deep wound with 9 cm x 4.5 cm superficial (skin) opening, attributed to necrotizing soft tissue infection (icd-10's: M72.6)  PROCEDURE(S):  1.) Sharp excisional debridement of 14 cm x 10 cm deep wound with 9 cm x 4.5 cm superficial (skin) opening (cpt: 11043) 2.) Application of 2-layer (larger deep and smaller superficial) negative pressure wound VAC dressing to Left lateral leg wound (cpt: 4401097608)  INTRAOPERATIVE FINDINGS: 14 cm x 10 cm deep wound with 9 cm x 4.5 cm superficial (skin) opening and several small multifocal areas of necrosis that appear secondary to prior debridement rather than overt ongoing soft tissue infection  INTRAVENOUS FLUIDS: 100 mL crystalloid   ESTIMATED BLOOD LOSS: Minimal (<20 mL)  URINE OUTPUT: No Foley   SPECIMENS: No Specimen  IMPLANTS: Negative pressure wound dressing (VAC) to Left lateral leg wound  DRAINS: none  COMPLICATIONS: None apparent  CONDITION AT END OF PROCEDURE: Hemodynamically stable and awake  DISPOSITION OF PATIENT: PACU  INDICATIONS FOR PROCEDURE:  56 year old Male [facility] with worsening Left lateral leg pain, cellulitis, and chills. CT demonstrated an open wound with a scant amount of subcutaneous tracking air, concerning for fasciitis. Patient was brought to the OR by Dr. Everlene FarrierPabon for incision and drainage, along with debridement, and negative pressure wound VAC was applied. Patient continues to have some peri-wound erythema and mild leukocytosis. All risks, benefits, and alternatives to above procedure were discussed with the patient, all of patient's  questions were answered to his expressed satisfaction, and informed consent was obtained and documented.  DETAILS OF PROCEDURE: Patient was brought to the operating suite and appropriately identified. IV sedation was administered by anesthetist along with appropriate pre-operative antibiotics. In supine position, operative site was prepped and draped in the usual sterile fashion, and following a brief time out, small multi-focal areas of necrosis that appear to be consistent with prior debridement rather than ongoing infection were further debrided using Metzenbaum scissors and scalpel. There did not appear to be any push or evidence of foul smell or dishwater-like grey fluid. Hemostasis was achieved, and wound was copiously irrigated with warm saline. Medium wound VAC sponge was divided into two thinner pieces (half of the original depth), one being cut into a larger piece to fit the larger deep wound with extensive undermining (see measurements above), while the other was trimmed to fit the smaller more superficial skin opening. Benzoin and the remainder of the adhesive film and tubing were applied and connected with a stable seal confirmed.  Patient was then safely able to be awakened and transferred to PACU for post-operative monitoring and care.  I was present for all aspects of the above procedure, and there were no complications apparent.

## 2016-09-24 NOTE — Transfer of Care (Signed)
Immediate Anesthesia Transfer of Care Note  Patient: Jeffrey Alvarado  Procedure(s) Performed: Procedure(s) with comments: DEBRIDEMENT WOUND (Left) - Left lower extremity APPLICATION OF WOUND VAC (Left) - Left lower extremity  Patient Location: PACU  Anesthesia Type:General  Level of Consciousness: awake  Airway & Oxygen Therapy: Patient connected to face mask oxygen  Post-op Assessment: Post -op Vital signs reviewed and stable  Post vital signs: stable  Last Vitals:  Vitals:   09/24/16 1330 09/24/16 1525  BP: (!) 162/104 (!) 159/107  Pulse: (!) 58 (!) 52  Resp: 18 15  Temp: (!) 36.1 C (!) 36.2 C  SpO2: 99% 100%    Last Pain:  Vitals:   09/24/16 1050  TempSrc: Oral  PainSc:       Patients Stated Pain Goal: 3 (09/22/16 2215)  Complications: No apparent anesthesia complications

## 2016-09-24 NOTE — Anesthesia Post-op Follow-up Note (Signed)
Anesthesia QCDR form completed.        

## 2016-09-24 NOTE — OR Nursing (Signed)
Vancomycin and Maxipime to OR with patient (Zosyn d/c'd per Annabella, pt's floor nurse.

## 2016-09-24 NOTE — Progress Notes (Signed)
[facility] - [place] [facility]   PATIENT NAME: Jeffrey Alvarado    MR#:  [id number]  DATE OF BIRTH:  [birth year]  SUBJECTIVE:  CHIEF COMPLAINT:   Chief Complaint  Patient presents with  . leg wound    Has screw and plate in his left  Fibula, pain and recurrent infections in the left leg. Again came with redness swelling and some discharge with pain on his left leg. Seen by surgery and orthopedic hands CT scan is done which does not show the spread of the infection to his hardware. S/p debridement 09/22/16 by surgery. Have wound vac.   Pt have some bradycardia and pauses episodes.  REVIEW OF SYSTEMS:  CONSTITUTIONAL: No fever, fatigue or weakness.  EYES: No blurred or double vision.  EARS, NOSE, AND THROAT: No tinnitus or ear pain.  RESPIRATORY: No cough, shortness of breath, wheezing or hemoptysis.  CARDIOVASCULAR: No chest pain, orthopnea, edema.  GASTROINTESTINAL: No nausea, vomiting, diarrhea or abdominal pain.  GENITOURINARY: No dysuria, hematuria.  ENDOCRINE: No polyuria, nocturia,  HEMATOLOGY: No anemia, easy bruising or bleeding SKIN: No rash or lesion. MUSCULOSKELETAL: No joint pain or arthritis. Pain and redness on left leg.  NEUROLOGIC: No tingling, numbness, weakness.  PSYCHIATRY: No anxiety or depression.   ROS  DRUG ALLERGIES:  No Known Allergies  VITALS:  Blood pressure (!) 148/99, pulse 84, temperature 98 F (36.7 C), temperature source Oral, resp. rate 14, height 5\' 11"  (1.803 m), weight 121.6 kg (268 lb), SpO2 99 %.  PHYSICAL EXAMINATION:  GENERAL:  56 y.o.-year-old patient lying in the bed with no acute distress.  EYES: Pupils equal, round, reactive to light and accommodation. No scleral icterus. Extraocular muscles intact.  HEENT: Head atraumatic, normocephalic. Oropharynx and nasopharynx clear.  NECK:  Supple, no jugular venous distention. No thyroid enlargement, no tenderness.  LUNGS: Normal breath sounds bilaterally, no wheezing,  rales,rhonchi or crepitation. No use of accessory muscles of respiration.  CARDIOVASCULAR: S1, S2 normal. No murmurs, rubs, or gallops.  ABDOMEN: Soft, nontender, nondistended. Bowel sounds present. No organomegaly or mass.  EXTREMITIES: Left leg medial one third is red, swollen, tender, warm to touch and woundvac in place. NEUROLOGIC: Cranial nerves II through XII are intact. Muscle strength 5/5 in all extremities. Sensation intact. Gait not checked.  PSYCHIATRIC: The patient is alert and oriented x 3.  SKIN: No obvious rash, lesion, or ulcer.   Physical Exam LABORATORY PANEL:   CBC  Recent Labs Lab 09/24/16 0412  WBC 11.3*  HGB 14.2  HCT 42.6  PLT 204   ------------------------------------------------------------------------------------------------------------------  Chemistries   Recent Labs Lab 09/23/16 0512 09/24/16 1948  NA 131* 138  K 4.6 4.1  CL 101 105  CO2 25 27  GLUCOSE 144* 123*  BUN 17 14  CREATININE 0.79 0.72  CALCIUM 8.2* 8.7*  MG 2.2  --   AST 24  --   ALT 23  --   ALKPHOS 54  --   BILITOT 0.7  --    ------------------------------------------------------------------------------------------------------------------  Cardiac Enzymes  Recent Labs Lab 09/23/16 0512  TROPONINI 0.06*   ------------------------------------------------------------------------------------------------------------------  RADIOLOGY:  No results found.  ASSESSMENT AND PLAN:   Active Problems:   Cellulitis and abscess of left lower extremity   Necrotizing soft tissue infection  56 year old male with past medical history of obesity, obstructive sleep apnea, hypertension, history of previous coronary disease and status post stent placement, history of previous MI who presents to the hospital due to left lower extremity redness swelling  and pain.  * Left lower extremity cellulitis with abscess-just the cause of patient's above symptoms as stated. Left lower extremity  Doppler was negative for DVT. - a contrast left lower extremity CT scan - confirms no spread to his hardware in leg,   Appreciated help of surgery and ortho. - patient on broad-spectrum IV antibiotics vancomycin, Zosyn, follow blood, bleed cultures. - s/p I & D 09/22/16, have wound vac, cx sent- will wait for result for Abx. - as per surgery- revision surgery planned today.  * New onset A fib   Started on cardiac monitor, troponin checked.    Echo ordered.   Was on metoprolol, but have a fib with slow heart rat and pauses, so stopepd metoprolol.   Appreciated cardio consult. Cleared for surgery.    TSH checked. Normal.  * Leukocytosis-secondary to #1. Next-follow with IV antibiotic therapy.  * Hyperlipidemia-continue Pravachol.  * Essential hypertension-continue metoprolol, Seroquel.  * Depression-continue Celexa.  * History of previous coronary artery disease and stent placement-continue aspirin, statin,, ACE inhibitor.    Held beta blocker.  All the records are reviewed and case discussed with Care Management/Social Workerr. Management plans discussed with the patient, family and they are in agreement.  CODE STATUS: full.  TOTAL TIME TAKING CARE OF THIS PATIENT: 35 minutes.    POSSIBLE D/C IN 2-3 DAYS, DEPENDING ON CLINICAL CONDITION.   Altamese Dilling M.D on 09/24/2016   Between 7am to 6pm - Pager - [phone]  After 6pm go to [email or web address] - password [email or web address]  [facility]  Office  [phone]  CC: Primary care physician; Evelene Croon, MD  Note: This dictation was prepared with Dragon dictation along with smaller phrase technology. Any transcriptional errors that result from this process are unintentional.

## 2016-09-24 NOTE — Anesthesia Preprocedure Evaluation (Signed)
Anesthesia Evaluation  Patient identified by MRN, date of birth, ID band Patient awake    Reviewed: Allergy & Precautions, H&P , NPO status , Patient's Chart, lab work & pertinent test results, reviewed documented beta blocker date and time   History of Anesthesia Complications Negative for: history of anesthetic complications  Airway Mallampati: I  TM Distance: >3 FB Neck ROM: full    Dental  (+) Missing   Pulmonary neg shortness of breath, sleep apnea , neg COPD, neg recent URI, Current Smoker,           Cardiovascular Exercise Tolerance: Good hypertension, (-) angina+ CAD, + Past MI and + Cardiac Stents  (-) CABG + dysrhythmias Atrial Fibrillation (-) Valvular Problems/Murmurs     Neuro/Psych PSYCHIATRIC DISORDERS (Depression) Depression negative neurological ROS     GI/Hepatic negative GI ROS, Neg liver ROS,   Endo/Other  negative endocrine ROS  Renal/GU negative Renal ROS  negative genitourinary   Musculoskeletal   Abdominal   Peds  Hematology negative hematology ROS (+)   Anesthesia Other Findings    Reproductive/Obstetrics negative OB ROS                             Anesthesia Physical  Anesthesia Plan  ASA: III  Anesthesia Plan: General   Post-op Pain Management:    Induction: Intravenous  PONV Risk Score and Plan: 1 and Ondansetron and Dexamethasone  Airway Management Planned: LMA  Additional Equipment:   Intra-op Plan:   Post-operative Plan: Extubation in OR  Informed Consent: I have reviewed the patients History and Physical, chart, labs and discussed the procedure including the risks, benefits and alternatives for the proposed anesthesia with the patient or authorized representative who has indicated his/her understanding and acceptance.   Dental Advisory Given  Plan Discussed with: Anesthesiologist, CRNA and Surgeon  Anesthesia Plan Comments:          Anesthesia Quick Evaluation

## 2016-09-25 DIAGNOSIS — M726 Necrotizing fasciitis: Secondary | ICD-10-CM

## 2016-09-25 LAB — BASIC METABOLIC PANEL
ANION GAP: 6 (ref 5–15)
BUN: 17 mg/dL (ref 6–20)
CHLORIDE: 106 mmol/L (ref 101–111)
CO2: 26 mmol/L (ref 22–32)
Calcium: 8.4 mg/dL — ABNORMAL LOW (ref 8.9–10.3)
Creatinine, Ser: 0.75 mg/dL (ref 0.61–1.24)
GFR calc Af Amer: >60 mL/min (ref >60)
GLUCOSE: 95 mg/dL (ref 65–99)
POTASSIUM: 4 mmol/L (ref 3.5–5.1)
Sodium: 138 mmol/L (ref 135–145)

## 2016-09-25 MED ORDER — DEXTROSE 5 % IV SOLN
2.0000 g | INTRAVENOUS | Status: DC
  Filled 2016-09-25: qty 2

## 2016-09-25 MED ORDER — LISINOPRIL 20 MG PO TABS
20.0000 mg | ORAL_TABLET | Freq: Every day | ORAL | Status: DC
  Administered 2016-09-25: 20 mg via ORAL
  Filled 2016-09-25: qty 1

## 2016-09-25 MED ORDER — HYDROCODONE-ACETAMINOPHEN 5-325 MG PO TABS: 1.0000 | ORAL_TABLET | Freq: Four times a day (QID) | ORAL | 0 refills | Status: DC | PRN

## 2016-09-25 MED ORDER — LISINOPRIL 20 MG PO TABS: 20.0000 mg | ORAL_TABLET | Freq: Every day | ORAL | 0 refills | Status: DC

## 2016-09-25 MED ORDER — SULFAMETHOXAZOLE-TRIMETHOPRIM 800-160 MG PO TABS: 1.0000 | ORAL_TABLET | Freq: Two times a day (BID) | ORAL | 0 refills | Status: AC

## 2016-09-25 NOTE — Care Management (Signed)
Patient admitted from home with necrotizing soft issue of the  Left leg.  Post I&D.  Patient with wound vac in place.  Patient is self pay.  Patient is agreeable to home health services.  Patient has been approved for charity home health RN services and wound vac through [facility].  Patient to discharge home today.  Wound Vac has been delivered to the room.  Patient has declined RW at discharge. Wound vac changed to be MWF.  Patient [facility].  Appointment information has been added to AVS. Patient provided coupons from [email or web address] for [email or web address]. RNCM Signing off.

## 2016-09-25 NOTE — Progress Notes (Signed)
Subjective:  Denies any chest pain no fever chills or sweats. Subsequent surgery yesterday feels reasonably well no blackout spells or syncope no lightheadedness no chest pain  Objective:  Vital Signs in the last 24 hours: Temp:  [96.9 F (36.1 C)-98 F (36.7 C)] 97.8 F (36.6 C) (08/15 0601) Pulse Rate:  [52-92] 72 (08/15 0601) Resp:  [13-20] 20 (08/15 0601) BP: (131-168)/(87-113) 162/95 (08/15 0601) SpO2:  [96 %-100 %] 98 % (08/15 0601) Weight:  [121.6 kg (268 lb)] 121.6 kg (268 lb) (08/14 1330)  Intake/Output from previous day: 08/14 0701 - 08/15 0700 In: 1421 [P.O.:480; I.V.:391; IV Piggyback:550] Out: 2975 [Urine:2975] Intake/Output from this shift: Total I/O In: 0  Out: 200 [Urine:200]  Physical Exam: General appearance: appears older than stated age Neck: no adenopathy, no carotid bruit, no JVD, supple, symmetrical, trachea midline and thyroid not enlarged, symmetric, no tenderness/mass/nodules Lungs: clear to auscultation bilaterally Heart: regular rate and rhythm, S1, S2 normal, no murmur, click, rub or gallop Abdomen: soft, non-tender; bowel sounds normal; no masses,  no organomegaly Extremities: edema Severe redness of her left leg abscess Pulses: 2+ and symmetric Skin: Skin color, texture, turgor normal. No rashes or lesions Neurologic: Alert and oriented X 3, normal strength and tone. Normal symmetric reflexes. Normal coordination and gait  Lab Results:  Recent Labs  09/23/16 0512 09/24/16 0412  WBC 12.0* 11.3*  HGB 14.2 14.2  PLT 173 204    Recent Labs  09/24/16 1948 09/25/16 0305  NA 138 138  K 4.1 4.0  CL 105 106  CO2 27 26  GLUCOSE 123* 95  BUN 14 17  CREATININE 0.72 0.75    Recent Labs  09/23/16 0512  TROPONINI 0.06*   Hepatic Function Panel  Recent Labs  09/23/16 0512  PROT 6.8  ALBUMIN 3.5  AST 24  ALT 23  ALKPHOS 54  BILITOT 0.7   No results for input(s): CHOL in the last 72 hours. No results for input(s): PROTIME in  the last 72 hours.  Imaging: Imaging results have been reviewed  Cardiac Studies:  Assessment/Plan:  Arrhythmia Atrial Fibrillation Coronary Artery Disease Edema Shortness of Breath  Hypertension Obstructive sleep apnea Cellulitis and abscess left lower leg Postop from leg surgery and debridement Hyperlipidemia therapy Obesity . Plan Agree with current medical therapy Continue telemetry for sick sinus syndrome Do not recommend permanent pacemaker during active infection Since the patient is dramatically with recommend conservative therapy follow-up as an outpatient with cardiology and EP Atrial fibrillation resolved back to sinus rhythm Do not recommend rate control drugs because of episodes bradycardia Do not currently recommend antiarrhythmic at this point patient's asymptomatic Defer anticoagulation at this point until follow-up with cardiology as an outpatient Continue broad-spectrum antibiotic therapy for MRSA cellulitis of left lower leg  LOS: 4 days    Dwayne D Callwood 09/25/2016, 12:21 PM

## 2016-09-25 NOTE — Progress Notes (Signed)
Discharge order received. Patient is alert and oriented. Vital signs stable . No signs of acute distress. Discharge instructions given. Patient verbalized understanding. Wound vac dressing changed from KCI brand to Medela. Suctioning well.No other issues noted at this time.

## 2016-09-25 NOTE — Discharge Instructions (Signed)

## 2016-09-25 NOTE — Discharge Summary (Signed)
[facility] - [place] at [facility]   PATIENT NAME: Jeffrey Alvarado    MR#:  [id number]  DATE OF BIRTH:  1960/09/09  DATE OF ADMISSION:  09/21/2016 ADMITTING PHYSICIAN: Houston Siren, MD  DATE OF DISCHARGE: 09/25/2016  PRIMARY CARE PHYSICIAN: Evelene Croon, MD    ADMISSION DIAGNOSIS:  Abscess of leg, left [L02.416] Cellulitis of left lower extremity without foot [L03.116] Soft tissue injury of left lower leg, initial encounter [S89.92XA]  DISCHARGE DIAGNOSIS:  Active Problems:   Cellulitis and abscess of left lower extremity   Necrotizing soft tissue infection   SECONDARY DIAGNOSIS:   Past Medical History:  Diagnosis Date  . Coronary artery disease 2009   s/p stent to RCA  . Depression   . Dyslipidemia   . History of myocardial infarction   . History of rectal bleeding   . Hypertension   . Obesity   . Obesity   . Obstructive sleep apnea     HOSPITAL COURSE:   56 year old male with past medical history of obesity, obstructive sleep apnea, hypertension, history of previous coronary disease and status post stent placement, history of previous MI who presents to the hospital due to left lower extremity redness swelling and pain.  * Left lower extremity cellulitis with abscess-just the cause of patient's above symptoms as stated. Left lower extremity Doppler was negative for DVT. - a contrast left lower extremity CT scan - confirms no spread to his hardware in leg,   Appreciated help of surgery and ortho. - patient on broad-spectrum IV antibiotics vancomycin, Zosyn, follow blood, bleed cultures. - s/p I & D 09/22/16, have wound vac, cx sent- MRSA and enterobacter- both sensitive to bactrim. - as per surgery- re-debridement is done 09/24/16, now need wound vac.  * New onset A fib   Started on cardiac monitor, troponin checked.    Echo checked.   Was on metoprolol, but have a fib with slow heart rat and pauses up to 3.8 seconds ,  multiple, so stopepd metoprolol.   Appreciated cardio consult. Cleared for surgery.    TSH checked. Normal.    As per cardio, may need a pace maker, but due to active infection- Does not want to do now. Pt is asymptomatic, so suggested to d/c with follow up in 1 week in office, explained the pt - he understands.  * Leukocytosis-secondary to #1. Next-follow with IV antibiotic therapy.  * Hyperlipidemia-continue Pravachol.  * Essential hypertension-continue metoprolol, Seroquel.  * Depression-continue Celexa.  * History of previous coronary artery disease and stent placement-continue aspirin, statin,, ACE inhibitor.    Held beta blocker.  DISCHARGE CONDITIONS:   Stable.  CONSULTS OBTAINED:  Treatment Team:  Leafy Ro, MD Dorothyann Peng D, MD  DRUG ALLERGIES:  No Known Allergies  DISCHARGE MEDICATIONS:   Current Discharge Medication List    START taking these medications   Details  sulfamethoxazole-trimethoprim (BACTRIM DS,SEPTRA DS) 800-160 MG tablet Take 1 tablet by mouth 2 (two) times daily. Qty: 20 tablet, Refills: 0      CONTINUE these medications which have CHANGED   Details  HYDROcodone-acetaminophen (NORCO/VICODIN) 5-325 MG tablet Take 1-2 tablets by mouth every 6 (six) hours as needed for moderate pain. Qty: 20 tablet, Refills: 0    lisinopril (PRINIVIL,ZESTRIL) 20 MG tablet Take 1 tablet (20 mg total) by mouth daily. Qty: 30 tablet, Refills: 0      CONTINUE these medications which have NOT CHANGED   Details  albuterol (PROVENTIL HFA;VENTOLIN HFA) 108 (  90 Base) MCG/ACT inhaler Inhale 2 puffs into the lungs every 4 (four) hours as needed for wheezing or shortness of breath. Qty: 1 Inhaler, Refills: 1    aspirin 325 MG tablet Take 325 mg by mouth daily.      citalopram (CELEXA) 40 MG tablet Take 40 mg by mouth daily.    nitroGLYCERIN (NITROSTAT) 0.4 MG SL tablet Place 0.4 mg under the tongue every 5 (five) minutes as needed.      pravastatin  (PRAVACHOL) 10 MG tablet Take 10 mg by mouth at bedtime.       STOP taking these medications     metoprolol tartrate (LOPRESSOR) 50 MG tablet      predniSONE (DELTASONE) 10 MG tablet      traMADol (ULTRAM) 50 MG tablet          DISCHARGE INSTRUCTIONS:    Follow with Surgical and Cardio clinic in 1-2 weeks.  If you experience worsening of your admission symptoms, develop shortness of breath, life threatening emergency, suicidal or homicidal thoughts you must seek medical attention immediately by calling 911 or calling your MD immediately  if symptoms less severe.  You Must read complete instructions/literature along with all the possible adverse reactions/side effects for all the Medicines you take and that have been prescribed to you. Take any new Medicines after you have completely understood and accept all the possible adverse reactions/side effects.   Please note  You were cared for by a hospitalist during your hospital stay. If you have any questions about your discharge medications or the care you received while you were in the hospital after you are discharged, you can call the unit and asked to speak with the hospitalist on call if the hospitalist that took care of you is not available. Once you are discharged, your primary care physician will handle any further medical issues. Please note that NO REFILLS for any discharge medications will be authorized once you are discharged, as it is imperative that you return to your primary care physician (or establish a relationship with a primary care physician if you do not have one) for your aftercare needs so that they can reassess your need for medications and monitor your lab values.    Today   CHIEF COMPLAINT:   Chief Complaint  Patient presents with  . leg wound    HISTORY OF PRESENT ILLNESS:  Jeffrey Alvarado  is a 56 y.o. male with a known history of Hypertension, obesity, obstructive sleep apnea, depression,  hyperlipidemia, previous history of MI who presents to the hospital due to left lower extremity redness swelling and pain. Patient says he developed swelling in his left lower extremity about 3-4 days ago and has progressively gotten worse. It's become more red and inflamed and more painful to touch and to ambulate with. He also admits to some chills but no documented fever at home. He also admits to night sweats. Patient presents to the hospital and was noted to have a significant left lower extremity cellulitis with abscess and hospitalist services were contacted further treatment and evaluation. Patient denies any chest pains, shortness of breath, nausea, vomiting, abdominal pain, headache, dizziness or any other associated symptoms presently.   VITAL SIGNS:  Blood pressure (!) 155/81, pulse 74, temperature 98 F (36.7 C), temperature source Oral, resp. rate 18, height 5\' 11"  (1.803 m), weight 121.6 kg (268 lb), SpO2 98 %.  I/O:   Intake/Output Summary (Last 24 hours) at 09/25/16 1453 Last data filed at 09/25/16 1406  Gross per 24 hour  Intake             1080 ml  Output             3650 ml  Net            -2570 ml    PHYSICAL EXAMINATION:   GENERAL:  56 y.o.-year-old patient lying in the bed with no acute distress.  EYES: Pupils equal, round, reactive to light and accommodation. No scleral icterus. Extraocular muscles intact.  HEENT: Head atraumatic, normocephalic. Oropharynx and nasopharynx clear.  NECK:  Supple, no jugular venous distention. No thyroid enlargement, no tenderness.  LUNGS: Normal breath sounds bilaterally, no wheezing, rales,rhonchi or crepitation. No use of accessory muscles of respiration.  CARDIOVASCULAR: S1, S2 normal. No murmurs, rubs, or gallops.  ABDOMEN: Soft, nontender, nondistended. Bowel sounds present. No organomegaly or mass.  EXTREMITIES: Left leg medial one third is in dressing and woundvac in place. NEUROLOGIC: Cranial nerves II through XII are intact.  Muscle strength 5/5 in all extremities. Sensation intact. Gait not checked.  PSYCHIATRIC: The patient is alert and oriented x 3.  SKIN: No obvious rash, lesion, or ulcer.   DATA REVIEW:   CBC  Recent Labs Lab 09/24/16 0412  WBC 11.3*  HGB 14.2  HCT 42.6  PLT 204    Chemistries   Recent Labs Lab 09/23/16 0512  09/25/16 0305  NA 131*  < > 138  K 4.6  < > 4.0  CL 101  < > 106  CO2 25  < > 26  GLUCOSE 144*  < > 95  BUN 17  < > 17  CREATININE 0.79  < > 0.75  CALCIUM 8.2*  < > 8.4*  MG 2.2  --   --   AST 24  --   --   ALT 23  --   --   ALKPHOS 54  --   --   BILITOT 0.7  --   --   < > = values in this interval not displayed.  Cardiac Enzymes  Recent Labs Lab 09/23/16 0512  TROPONINI 0.06*    Microbiology Results  Results for orders placed or performed during the hospital encounter of 09/21/16  Wound or Superficial Culture     Status: None   Collection Time: 09/21/16  3:49 PM  Result Value Ref Range Status   Specimen Description ABSCESS LOWER LEFT LEG  Final   Special Requests Normal  Final   Gram Stain   Final    RARE WBC PRESENT, PREDOMINANTLY PMN ABUNDANT GRAM POSITIVE COCCI Performed at [facility], [address] [place], [place] [id number]    Culture   Final    ABUNDANT METHICILLIN RESISTANT STAPHYLOCOCCUS AUREUS FEW ENTEROBACTER SPECIES    Report Status 09/24/2016 FINAL  Final   Organism ID, Bacteria METHICILLIN RESISTANT STAPHYLOCOCCUS AUREUS  Final   Organism ID, Bacteria ENTEROBACTER SPECIES  Final      Susceptibility   Enterobacter species - MIC*    CEFAZOLIN >=64 RESISTANT Resistant     CEFEPIME <=1 SENSITIVE Sensitive     CEFTAZIDIME <=1 SENSITIVE Sensitive     CEFTRIAXONE <=1 SENSITIVE Sensitive     CIPROFLOXACIN <=0.25 SENSITIVE Sensitive     GENTAMICIN <=1 SENSITIVE Sensitive     IMIPENEM 0.5 SENSITIVE Sensitive     TRIMETH/SULFA <=20 SENSITIVE Sensitive     PIP/TAZO <=4 SENSITIVE Sensitive     * FEW ENTEROBACTER SPECIES    Methicillin resistant staphylococcus aureus -  MIC*    CIPROFLOXACIN <=0.5 SENSITIVE Sensitive     ERYTHROMYCIN 1 INTERMEDIATE Intermediate     GENTAMICIN <=0.5 SENSITIVE Sensitive     OXACILLIN >=4 RESISTANT Resistant     TETRACYCLINE <=1 SENSITIVE Sensitive     VANCOMYCIN <=0.5 SENSITIVE Sensitive     TRIMETH/SULFA <=10 SENSITIVE Sensitive     CLINDAMYCIN <=0.25 SENSITIVE Sensitive     RIFAMPIN <=0.5 SENSITIVE Sensitive     Inducible Clindamycin NEGATIVE Sensitive     * ABUNDANT METHICILLIN RESISTANT STAPHYLOCOCCUS AUREUS  Culture, blood (Routine X 2) w Reflex to ID Panel     Status: None (Preliminary result)   Collection Time: 09/21/16  3:49 PM  Result Value Ref Range Status   Specimen Description BLOOD BLOOD RIGHT HAND  Final   Special Requests   Final    BOTTLES DRAWN AEROBIC AND ANAEROBIC Blood Culture results may not be optimal due to an excessive volume of blood received in culture bottles   Culture NO GROWTH 4 DAYS  Final   Report Status PENDING  Incomplete  Culture, blood (Routine X 2) w Reflex to ID Panel     Status: None (Preliminary result)   Collection Time: 09/21/16  4:15 PM  Result Value Ref Range Status   Specimen Description BLOOD BLOOD LEFT HAND  Final   Special Requests   Final    BOTTLES DRAWN AEROBIC AND ANAEROBIC Blood Culture adequate volume   Culture NO GROWTH 4 DAYS  Final   Report Status PENDING  Incomplete  Aerobic/Anaerobic Culture (surgical/deep wound)     Status: None (Preliminary result)   Collection Time: 09/22/16  3:38 PM  Result Value Ref Range Status   Specimen Description ABSCESS LEFT LEG  Final   Special Requests NONE  Final   Gram Stain   Final    RARE WBC PRESENT, PREDOMINANTLY PMN FEW GRAM POSITIVE COCCI IN PAIRS IN CLUSTERS RARE GRAM NEGATIVE RODS    Culture   Final    MODERATE ENTEROBACTER SPECIES ABUNDANT UNIDENTIFIED ORGANISM IDENTIFICATION AND SUSCEPTIBILITIES TO FOLLOW Performed at [facility], [address],  [place], [place] [address]    Report Status PENDING  Incomplete   Organism ID, Bacteria ENTEROBACTER SPECIES  Final      Susceptibility   Enterobacter species - MIC*    CEFAZOLIN >=64 RESISTANT Resistant     CEFEPIME <=1 SENSITIVE Sensitive     CEFTAZIDIME <=1 SENSITIVE Sensitive     CEFTRIAXONE <=1 SENSITIVE Sensitive     CIPROFLOXACIN <=0.25 SENSITIVE Sensitive     GENTAMICIN <=1 SENSITIVE Sensitive     IMIPENEM 0.5 SENSITIVE Sensitive     TRIMETH/SULFA <=20 SENSITIVE Sensitive     PIP/TAZO <=4 SENSITIVE Sensitive     * MODERATE ENTEROBACTER SPECIES    RADIOLOGY:  No results found.  EKG:   Orders placed or performed during the hospital encounter of 09/21/16  . EKG 12-Lead  . EKG 12-Lead  . EKG 12-Lead  . EKG 12-Lead      Management plans discussed with the patient, family and they are in agreement.  CODE STATUS:     Code Status Orders        Start     Ordered   09/21/16 1933  Full code  Continuous     09/21/16 1932    Code Status History    Date Active Date Inactive Code Status Order ID Comments User Context   This patient has a current code status but no historical code status.  Advance Directive Documentation     Most Recent Value  Type of Advance Directive  Healthcare Power of Attorney  Pre-existing out of facility DNR order (yellow form or pink MOST form)  -  "MOST" Form in Place?  -      TOTAL TIME TAKING CARE OF THIS PATIENT: 40 minutes.    Altamese Dilling M.D on 09/25/2016 at 2:53 PM  Between 7am to 6pm - Pager - [phone]  After 6pm go to [email or web address] - password [email or web address]  [facility]  Office  [phone]  CC: Primary care physician; Evelene Croon, MD   Note: This dictation was prepared with Dragon dictation along with smaller phrase technology. Any transcriptional errors that result from this process are unintentional.

## 2016-09-25 NOTE — Consult Note (Signed)
Reason for Consult: Left leg cellulitis and abscess, sick sinus syndrome, atrial fibrillation paroxysmal Referring Physician: Dr. Romie Levee is an 56 y.o. male.  HPI: Patient presents with left leg swelling redness pain found to have cellulitis and abscess status post incision and drainage now has drainage tube in place patient was found to have MRSA. Patient's had episodes hypertension obesity obstructive sleep apnea hyperlipidemia previous myocardial infarction and PCI and stent the past. Patient was found to have arrhythmias suggestive of atrial fibrillation sick sinus syndrome patient states to be asymptomatic no blackout spells or syncope. Patient denies any chest pain cardiology was referred for further assessment of tachycardia and atrial fibrillation. Patient's also had sick sinus syndrome with episodes of bradycardia totally asymptomatic  Past Medical History:  Diagnosis Date  . Coronary artery disease 2009   s/p stent to RCA  . Depression   . Dyslipidemia   . History of myocardial infarction   . History of rectal bleeding   . Hypertension   . Obesity   . Obesity   . Obstructive sleep apnea     Past Surgical History:  Procedure Laterality Date  . ANKLE SURGERY Left   . APPLICATION OF WOUND VAC Left 09/24/2016   Procedure: APPLICATION OF WOUND VAC;  Surgeon: Vickie Epley, MD;  Location: [facility] ORS;  Service: Vascular;  Laterality: Left;  Left lower extremity  . CARDIAC CATHETERIZATION  2009   s/p right coronary stent   . INCISION AND DRAINAGE ABSCESS Left 09/22/2016   Procedure: INCISION AND DRAINAGE ABSCESS and application of wound vac;  Surgeon: Jules Husbands, MD;  Location: [facility] ORS;  Service: General;  Laterality: Left;  . WOUND DEBRIDEMENT Left 09/24/2016   Procedure: DEBRIDEMENT WOUND;  Surgeon: Vickie Epley, MD;  Location: [facility] ORS;  Service: Vascular;  Laterality: Left;  Left lower extremity    Family History  Problem Relation Age of Onset  .  Atrial fibrillation Unknown        FH  . Other Daughter        liver transplant  . Atrial fibrillation Mother   . Diabetes Father     Social History:  reports that he has been smoking Cigarettes.  He has a 35.00 pack-year smoking history. He has never used smokeless tobacco. He reports that he drinks alcohol. He reports that he does not use drugs.  Allergies: No Known Allergies  Medications: I have reviewed the patient's current medications.  Results for orders placed or performed during the hospital encounter of 09/21/16 (from the past 48 hour(s))  Vancomycin, trough     Status: None   Collection Time: 09/23/16  1:25 PM  Result Value Ref Range   Vancomycin Tr 16 15 - 20 ug/mL  CBC     Status: Abnormal   Collection Time: 09/24/16  4:12 AM  Result Value Ref Range   WBC 11.3 (H) 3.8 - 10.6 K/uL   RBC 4.18 (L) 4.40 - 5.90 MIL/uL   Hemoglobin 14.2 13.0 - 18.0 g/dL   HCT 42.6 40.0 - 52.0 %   MCV 101.8 (H) 80.0 - 100.0 fL   MCH 34.0 26.0 - 34.0 pg   MCHC 33.3 32.0 - 36.0 g/dL   RDW 13.8 11.5 - 14.5 %   Platelets 204 150 - 440 K/uL  Basic metabolic panel     Status: Abnormal   Collection Time: 09/24/16  7:48 PM  Result Value Ref Range   Sodium 138 135 - 145 mmol/L  Potassium 4.1 3.5 - 5.1 mmol/L   Chloride 105 101 - 111 mmol/L   CO2 27 22 - 32 mmol/L   Glucose, Bld 123 (H) 65 - 99 mg/dL   BUN 14 6 - 20 mg/dL   Creatinine, Ser 0.72 0.61 - 1.24 mg/dL   Calcium 8.7 (L) 8.9 - 10.3 mg/dL   GFR calc non Af Amer >60 >60 mL/min   GFR calc Af Amer >60 >60 mL/min    Comment: (NOTE) The eGFR has been calculated using the CKD EPI equation. This calculation has not been validated in all clinical situations. eGFR's persistently <60 mL/min signify possible Chronic Kidney Disease.    Anion gap 6 5 - 15  Basic metabolic panel     Status: Abnormal   Collection Time: 09/25/16  3:05 AM  Result Value Ref Range   Sodium 138 135 - 145 mmol/L   Potassium 4.0 3.5 - 5.1 mmol/L   Chloride 106  101 - 111 mmol/L   CO2 26 22 - 32 mmol/L   Glucose, Bld 95 65 - 99 mg/dL   BUN 17 6 - 20 mg/dL   Creatinine, Ser 0.75 0.61 - 1.24 mg/dL   Calcium 8.4 (L) 8.9 - 10.3 mg/dL   GFR calc non Af Amer >60 >60 mL/min   GFR calc Af Amer >60 >60 mL/min    Comment: (NOTE) The eGFR has been calculated using the CKD EPI equation. This calculation has not been validated in all clinical situations. eGFR's persistently <60 mL/min signify possible Chronic Kidney Disease.    Anion gap 6 5 - 15    No results found.  Review of Systems  Constitutional: Positive for chills, fever and malaise/fatigue.  HENT: Positive for congestion.   Eyes: Negative.   Respiratory: Positive for shortness of breath.   Cardiovascular: Positive for palpitations and leg swelling.  Gastrointestinal: Negative.   Genitourinary: Negative.   Musculoskeletal: Positive for myalgias.  Skin: Positive for rash.  Neurological: Positive for weakness.  Endo/Heme/Allergies: Negative.   Psychiatric/Behavioral: The patient is nervous/anxious.    Blood pressure (!) 162/95, pulse 72, temperature 97.8 F (36.6 C), temperature source Oral, resp. rate 20, height 5' 11"  (1.803 m), weight 121.6 kg (268 lb), SpO2 98 %. Physical Exam  Nursing note and vitals reviewed. Constitutional: He is oriented to person, place, and time. He appears well-developed and well-nourished.  HENT:  Head: Normocephalic and atraumatic.  Eyes: Pupils are equal, round, and reactive to light. Conjunctivae and EOM are normal.  Neck: Normal range of motion. Neck supple.  Cardiovascular: Normal rate and regular rhythm.   Murmur heard. Respiratory: Effort normal and breath sounds normal.  GI: Soft. Bowel sounds are normal.  Musculoskeletal: He exhibits edema.  Left lower leg swelling and redness discharge abscess in place status post drainage  Neurological: He is alert and oriented to person, place, and time. He has normal reflexes.  Skin: Skin is warm. There is  erythema.  Psychiatric: He has a normal mood and affect. His behavior is normal.    Assessment/Plan: Left lower leg abscess Postop with wound drainage MRSA cellulitis Sick sinus syndrome Bradycardia Known coronary disease to RCA Sleep apnea Obesity Hyperlipidemia Depression Atrial fibrillation paroxysmal Edema of lower extremity . Plan Agree with incision and drainage by surgery for leg abscess Continue broad-spectrum antibiotic therapy for wound infection Continue telemetry for sick sinus syndrome. Atrial fibrillation Recommend weight loss exercise portion control CPAP sleep study weight loss for obstructive sleep apnea Statin therapy for lipid management No  clear indication for permanent pacemaker since patient is relatively asymptomatic Echocardiogram for assessment of left ventricular function valvular disease Have the patient follow-up with cardiology as an outpatient for further assessment and evaluation for sick sinus syndrome possible permanent pacemaker   Berklee Battey D Derry Kassel 09/25/2016, 12:10 PM

## 2016-09-26 ENCOUNTER — Telehealth: Payer: Self-pay

## 2016-09-26 LAB — CULTURE, BLOOD (ROUTINE X 2)
CULTURE: NO GROWTH
Culture: NO GROWTH
Special Requests: ADEQUATE

## 2016-09-26 NOTE — Telephone Encounter (Signed)
Post-op call made to patient at this time. No answer. Left voicemail for return phone call.  

## 2016-09-26 NOTE — Telephone Encounter (Signed)
Post-op appointment made once again. No answer. Left voicemail for return phone call. Would be glad to speak with patient if he returns phone calls.

## 2016-09-30 ENCOUNTER — Telehealth: Payer: Self-pay | Admitting: Surgery

## 2016-09-30 LAB — AEROBIC/ANAEROBIC CULTURE W GRAM STAIN (SURGICAL/DEEP WOUND)

## 2016-09-30 LAB — AEROBIC/ANAEROBIC CULTURE (SURGICAL/DEEP WOUND)

## 2016-09-30 NOTE — Telephone Encounter (Signed)
Patient would like note to return to work tomorrow. Was told by Dr. Earlene Plater that he would be clear. Patient would like to pick up at 1:30 today.

## 2016-09-30 NOTE — Telephone Encounter (Signed)
Patient requested to go back to work, therefore, he wanted a letter stating that he was able to return to work on Tuesday. Letter will be left at the front desk for him to pick up.

## 2016-10-01 ENCOUNTER — Telehealth: Payer: Self-pay | Admitting: Surgery

## 2016-10-01 NOTE — Telephone Encounter (Signed)
Called patient and he stated that he was having a lot of pain on his left leg. He stated that he was given pain medication but ran out last night. I asked him what he was taking since then for his pain. He stated that he had been taking Tylenol 1000 MG every 6 hours and has not helped him. Therefore, I recommended for him to start taking Ibuprofen 800 MG every 8 hours and that we could see him tomorrow morning instead of Friday. I told him that if Dr. Earlene Plater saw him tomorrow, then he could prescribe him more pain medication. Patient agreed in seeing Dr. Earlene Plater tomorrow at 9:00 AM but to arrive at 8:45 AM.

## 2016-10-01 NOTE — Telephone Encounter (Signed)
Patient is in a lot of pain - would like something called in. Please advise.

## 2016-10-02 ENCOUNTER — Encounter: Payer: Self-pay | Admitting: Surgery

## 2016-10-02 ENCOUNTER — Ambulatory Visit (INDEPENDENT_AMBULATORY_CARE_PROVIDER_SITE_OTHER): Payer: Self-pay | Admitting: Surgery

## 2016-10-02 VITALS — BP 132/89 | HR 101 | Temp 98.1°F | Ht 71.0 in | Wt 272.6 lb

## 2016-10-02 DIAGNOSIS — L03116 Cellulitis of left lower limb: Secondary | ICD-10-CM

## 2016-10-02 DIAGNOSIS — M7989 Other specified soft tissue disorders: Secondary | ICD-10-CM

## 2016-10-02 MED ORDER — HYDROCODONE-ACETAMINOPHEN 5-325 MG PO TABS: 1.0000 | ORAL_TABLET | Freq: Four times a day (QID) | ORAL | 0 refills | Status: DC | PRN

## 2016-10-02 NOTE — Progress Notes (Signed)
Surgical Clinic Progress/Follow-up Note   HPI:  56 y.o. Male presents to clinic for post-op follow-up evaluation, 1 week s/p Left lateral leg debridement for necrotizing soft tissue infection with placement of negative pressure wound VAC dressing. Patient reports he had some pain yesterday following his most recent wound VAC dressing change, but otherwise his pain has been tolerable and controlled with his home RN telling him the wound has been looking good. He denies any fever/chills, claudication, CP, or SOB, and has reduced, but not quit, smoking. He also states that he has 3 or 4 days left of the oral antibiotics he was prescribed.  Review of Systems:  Constitutional: denies any other weight loss, fever, chills, or sweats  Eyes: denies any other vision changes, history of eye injury  ENT: denies sore throat, hearing problems  Respiratory: denies shortness of breath, wheezing  Cardiovascular: denies chest pain, palpitations  Gastrointestinal: abdominal pain, N/V, and bowel function as per HPI Musculoskeletal: denies any other joint pains or cramps  Skin: Denies any other rashes or skin discolorations  Neurological: denies any other headache, dizziness, weakness  Psychiatric: denies any other depression, anxiety  All other review of systems: otherwise negative   Vital Signs:  BP 132/89   Pulse (!) 101   Temp 98.1 F (36.7 C) (Oral)   Ht 5\' 11"  (1.803 m)   Wt 272 lb 9.6 oz (123.7 kg)   BMI 38.02 kg/m    Physical Exam:  Constitutional:  -- Overweight body habitus  -- Awake, alert, and oriented x3  Eyes:  -- Pupils equally round and reactive to light  -- No scleral icterus  Ear, nose, throat:  -- No jugular venous distension  -- No nasal drainage, bleeding Pulmonary:  -- No crackles -- Equal breath sounds bilaterally -- Breathing non-labored at rest Cardiovascular:  -- S1, S2 present  -- No pericardial rubs  Gastrointestinal:  -- Soft, nontender, nondistended, no  guarding/rebound  -- No abdominal masses appreciated, pulsatile or otherwise  Musculoskeletal / Integumentary:  -- Wounds or skin discoloration: Left lateral leg wound appears to be contracting, smaller than previous exam, though posterior and inferior borders appear depressed in comparison to the surrounding tissues as if the VAC sponge has been over the skin rather than filling the undermining space to promote healing by secondary intent without creation of cavities and possible abscesses, no appreciable necrosis, bleeding, or purulent drainage  -- Extremities: B/L UE and LE FROM, hands and feet warm, no edema  Neurologic:  -- Motor function: intact and symmetric  -- Sensation: intact and symmetric   Assessment:  56 y.o. yo Male with a problem list including...  Patient Active Problem List   Diagnosis Date Noted  . Necrotizing soft tissue infection   . Cellulitis and abscess of left lower extremity 09/21/2016  . Anxiety 04/13/2014  . CAD (coronary artery disease) 04/13/2014  . Cellulitis of left lower leg 04/13/2014  . Essential hypertension 04/13/2014  . Tobacco abuse 04/13/2014    presents to clinic for post-op follow-up evaluation of Left lateral leg wound s/p incision/drainage and debridement of necrotizing soft tissue infection, doing overall well.  Plan:   - Percocet x 10 additional tablets prescribed   - continue wound VAC changes 3x/week by home health, must fill undermined areas with sponge  - complete remaining course of prescribed oral antibiotics even if feeling better  - return to clinic in 1 week after antibiotics completed  - anticipate longer time before subsequent follow-up  - call  office if any questions or concerns  All of the above recommendations were discussed with the patient, and all of patient's questions were answered to his expressed satisfaction.  -- Scherrie Gerlach Earlene Plater, MD, RPVI [place]: [facility] General [facility] - Partnering for  exceptional care. Office: [phone]

## 2016-10-02 NOTE — Patient Instructions (Addendum)
We have scheduled you to follow up with [place] as listed below:   We have placed your wound vac on today. Be sure to have home health place the wound vac under all areas of the wound. If you have any questions or concerns please give our office a call prior to this appointment.

## 2016-10-04 ENCOUNTER — Other Ambulatory Visit: Payer: Self-pay | Admitting: Internal Medicine

## 2016-10-04 ENCOUNTER — Encounter: Payer: Self-pay | Admitting: Surgery

## 2016-10-07 DIAGNOSIS — M726 Necrotizing fasciitis: Secondary | ICD-10-CM

## 2016-10-15 ENCOUNTER — Telehealth: Payer: Self-pay

## 2016-10-15 ENCOUNTER — Encounter: Payer: Self-pay | Admitting: Emergency Medicine

## 2016-10-15 ENCOUNTER — Emergency Department
Admission: EM | Admit: 2016-10-15 | Discharge: 2016-10-15 | Disposition: A | Discharge status: Home / Self Care | Payer: Self-pay | Attending: Emergency Medicine | Admitting: Emergency Medicine

## 2016-10-15 DIAGNOSIS — Z5329 Procedure and treatment not carried out because of patient's decision for other reasons: Secondary | ICD-10-CM | POA: Insufficient documentation

## 2016-10-15 DIAGNOSIS — R2242 Localized swelling, mass and lump, left lower limb: Secondary | ICD-10-CM | POA: Insufficient documentation

## 2016-10-15 NOTE — ED Notes (Signed)
Called twice-no answer

## 2016-10-15 NOTE — Telephone Encounter (Signed)
Sherri from [facility] called stating that she seen the patient yesterday for wound check. She stated that the wound looks fine however the patient has a great amount of swelling in that leg and that she believe that the swelling is due to him standing for long periods of time at work. She has suggested to the patient to refrain from working if he can but he states that he needs to work. Patient is due to be seen tomorrow in office. I advised that I would get this information documented.

## 2016-10-15 NOTE — ED Triage Notes (Addendum)
Pt with c/o increased swelling to left lower leg, pt post op from wound debriedment with wound vac in place after surgery. Pt noted to have swelling to left lower leg. No increased redness or warmth per pt , denies any pain. Pt thinks he went back to work too soon.

## 2016-10-16 ENCOUNTER — Telehealth: Payer: Self-pay

## 2016-10-16 ENCOUNTER — Ambulatory Visit (INDEPENDENT_AMBULATORY_CARE_PROVIDER_SITE_OTHER): Payer: Self-pay | Admitting: Surgery

## 2016-10-16 ENCOUNTER — Encounter: Payer: Self-pay | Admitting: Surgery

## 2016-10-16 VITALS — BP 184/114 | HR 98 | Temp 98.1°F | Wt 281.0 lb

## 2016-10-16 DIAGNOSIS — Z4889 Encounter for other specified surgical aftercare: Secondary | ICD-10-CM

## 2016-10-16 DIAGNOSIS — M726 Necrotizing fasciitis: Secondary | ICD-10-CM

## 2016-10-16 NOTE — Progress Notes (Signed)
Surgical Clinic Progress/Follow-up Note   HPI:  56 y.o. Male presents to clinic for follow-up evaluation of Left leg wound 3.5 weeks s/p debridement for necrotizing soft tissue infection and 2 weeks since he completed prescribed course of oral antibiotics, since which time he has missed his scheduled follow-up appointment. Patient reports he continues to smoke unchanged, ran out of his metoprolol prior to hospital admission 1 month ago (still taking lisinopril), and has had a productive cough with recent URI symptoms over the past couple of days, for which he is scheduled to see a new PMD this Friday, 9/7. He also reports B/L lower extremity swelling >2 weeks without calf pain that he describes is worse in the afternoon/evening than in the mornings, otherwise denies fever/chills or CP.  Review of Systems:  Constitutional: denies any other weight loss, fever, chills, or sweats  Eyes: denies any other vision changes, history of eye injury  ENT: denies sore throat, hearing problems  Respiratory: denies shortness of breath, wheezing  Cardiovascular: denies chest pain, palpitations  Gastrointestinal: denies abdominal pain, N/V, or diarrhea Musculoskeletal: denies any other joint pains or cramps  Skin: Denies any other rashes or skin discolorations except as per HPI Neurological: denies any other headache, dizziness, weakness  Psychiatric: denies any other depression, anxiety  All other review of systems: otherwise negative   Vital Signs:  BP (!) 184/114   Pulse 98   Temp 98.1 F (36.7 C) (Oral)   Wt 281 lb (127.5 kg)   BMI 39.19 kg/m    Physical Exam:  Constitutional:  -- Obese body habitus  -- Awake, alert, and oriented x3  Eyes:  -- Pupils equally round and reactive to light  -- No scleral icterus  Ear, nose, throat:  -- No jugular venous distension  -- No nasal drainage, bleeding Pulmonary:  -- No crackles -- Equal breath sounds bilaterally -- Breathing non-labored at  rest Cardiovascular:  -- S1, S2 present  -- No pericardial rubs  Gastrointestinal:  -- Soft, nontender, nondistended, no guarding/rebound  -- No abdominal masses appreciated, pulsatile or otherwise  Musculoskeletal / Integumentary:  -- Wounds or skin discoloration: Left lateral leg wound slowly appears to be contracting, smaller than previous exam, with pink-beefy red granulation tissue of wound base, though inferior-posterior borders again appear depressed in comparison to the surrounding tissues with the VAC sponge placed over the skin rather than filling the undermining space to promote healing by secondary intent without creation of cavities and possible abscesses as has previously been noted and instructed, minimal surrounding erythema and no appreciable necrosis, bleeding, or purulent drainage -- Extremities: B/L UE and LE FROM, hands and feet warm, B/L 2+ lower extremity edema, B/L foul-smelling feet Neurologic:  -- Motor function: intact and symmetric  -- Sensation: intact and symmetric   Pulse/Dopper Exam:  (p=palpable; d=doppler signals; 0=none)    Right   Left   Fem  p   p   Pop  p   p   DP  p   p  Assessment:  56 y.o. yo Male with a problem list including...  Patient Active Problem List   Diagnosis Date Noted  . Necrotizing fasciitis (HCC)   . Necrotizing soft tissue infection   . Cellulitis and abscess of left lower extremity 09/21/2016  . Anxiety 04/13/2014  . CAD (coronary artery disease) 04/13/2014  . Cellulitis of left lower leg 04/13/2014  . Essential hypertension 04/13/2014  . Tobacco abuse 04/13/2014    presents to clinic for post-op follow-up  evaluation of Left leg wound 3.5 weeks s/p debridement for necrotizing soft tissue infection and 2 weeks since he completed his prescribed course of oral antibiotics, since which time he has also missed his scheduled follow-up appointment.  Plan:   - will contact patient's PMD re: antihypertensive medication refill  -  importance of hygiene discussed with patient in regards to feet and wound  - okay to shower between VAC removal and replacement, need to communicate and coordinate with home RN  - continue TID wound VAC changes with gauze/sponge to fill undermining periphery of subcutaneous wound  - smoking cessation and close follow-up with PMD for URI and pulmonary health strongly encouraged  - return to clinic in 2 weeks, instructed to call office if any questions or concerns  All of the above recommendations were discussed with the patient, and all of patient's questions were answered to his expressed satisfaction.  -- Scherrie Gerlach Earlene Plater, MD, RPVI [place]: [facility] - Partnering for exceptional care. Office: [phone]

## 2016-10-16 NOTE — Patient Instructions (Addendum)
Please continue to care your wound. Remove your wound vac the days that you know that the home health nurses come to your home and make sure that you shower so soap and water runs on your wound so it could get cleaned. Then pat it dry and let the nurses apply the 4x4 gauzes and make sure that they go inside the creases. Please show the nurses the pictures so they see what Dr. Earlene Plateravis wants them to do.  I will contact Dr. Martie RoundNeimeyer's office so hopefully they could refill your Metoprolol. Then give them a call to cancel the scheduled appointment.   Please go and see your new Provider so they could take care of your health.  Please give us a call in case you have any questions or concerns.

## 2016-10-16 NOTE — Telephone Encounter (Signed)
Called Dr. Carron BrazenNiemeyer's office and asked to speak to his nurse since patient's blood pressure was 184/114 since he had been out of his Metoprolol.  Ami (front desk receptionist) wanted me to send Dr. Jinny Sandersavis's notes from today and hopefully Dr. Lacie ScottsNiemeyer could refill his medication since patient had not been seen since 10/18/2015. I told Ami that I would. They would contact patient in case they agree on giving him the refill on his Metoprolol.  Fax Number: [phone] Attention: Erma PintoKathe

## 2016-10-17 NOTE — Telephone Encounter (Signed)
Called [facility] (Dr. Carron BrazenNiemeyer's office) and asked Lowella Bandyikki (front desk receptionist) to leave a message to the nurse in reference to our patient and his blood pressure medication being refilled. I was told that I would receive a call back with a response.  Lowella Bandyikki called back and stated that she had spoken to the nurse and that since the patient had not been seen for a year, then they would not refill his metoprolol and that the patient would not schedule an appointment with them since he was going to be seen at [facility] on 10/18/2016.

## 2016-10-21 ENCOUNTER — Telehealth: Payer: Self-pay | Admitting: General Practice

## 2016-10-21 NOTE — Telephone Encounter (Signed)
[facility] from [facility] wanted to let us know that the patient didn't have the specific gauze to pack the wound with and she used what she had on hand. The patient's supplies were on his front porch of which he was unaware of.

## 2016-10-21 NOTE — Telephone Encounter (Signed)
Jeffrey Alvarado called and left a message on Friday @ 3:48pm,Jeffrey Alvarado with [facility] called asking for one of the nurses to give her a call at [phone]. Please call Jeffrey and advice.

## 2016-10-22 ENCOUNTER — Ambulatory Visit (INDEPENDENT_AMBULATORY_CARE_PROVIDER_SITE_OTHER): Payer: Self-pay | Admitting: Cardiovascular Disease

## 2016-10-22 ENCOUNTER — Encounter: Payer: Self-pay | Admitting: Cardiovascular Disease

## 2016-10-22 VITALS — BP 159/113 | HR 87 | Ht 71.0 in | Wt 278.0 lb

## 2016-10-22 DIAGNOSIS — I4819 Other persistent atrial fibrillation: Secondary | ICD-10-CM

## 2016-10-22 DIAGNOSIS — I2510 Atherosclerotic heart disease of native coronary artery without angina pectoris: Secondary | ICD-10-CM

## 2016-10-22 DIAGNOSIS — E785 Hyperlipidemia, unspecified: Secondary | ICD-10-CM

## 2016-10-22 DIAGNOSIS — I481 Persistent atrial fibrillation: Secondary | ICD-10-CM

## 2016-10-22 DIAGNOSIS — I10 Essential (primary) hypertension: Secondary | ICD-10-CM

## 2016-10-22 MED ORDER — LOSARTAN POTASSIUM 100 MG PO TABS: 100.0000 mg | ORAL_TABLET | Freq: Every day | ORAL | 3 refills | Status: DC

## 2016-10-22 NOTE — Progress Notes (Signed)
Cardiology Office Note   Date:  10/22/2016   ID:  Jeffrey Alvarado, DOB 20-Oct-1960, MRN [id number]  PCP:  [facility]  Cardiologist:   Lorine Bears, MD   Chief Complaint  Patient presents with  . Follow-up    Pt states no Sx.       History of Present Illness: Jeffrey Alvarado is a 56 y.o. male who presents To establish cardiovascular care. He has known history of coronary artery disease status post non-ST elevation myocardial infarction September 2009. Cardiac catheterization at that time showed 95% proximal RCA stenosis which was treated with bare-metal stent placement. He has other chronic medical conditions that include hypertension, hyperlipidemia, depression, obesity and tobacco use. He was recently hospitalized at [facility] with abscess of the left lower extremity. He was noted to be in atrial fibrillation with controlled ventricular rate. He was noted to have 3.5 second pauses and initially metoprolol was discontinued. The patient was seen by [facility] while hospitalized. Echocardiogram showed normal LV systolic function with no significant valvular abnormalities. The patient could not establish with [facility] due to lack of health insurance. He reports feeling reasonably well with no chest pain, shortness of breath or palpitations. He is trying to obtain health insurance through his work at [employer].     Past Medical History:  Diagnosis Date  . Anxiety 04/13/2014  . CAD (coronary artery disease) 04/13/2014  . Cellulitis and abscess of left lower extremity 09/21/2016  . Cellulitis of left lower leg 04/13/2014  . Coronary artery disease 2009   s/p stent to RCA  . Depression   . Dyslipidemia   . Essential hypertension 04/13/2014  . History of myocardial infarction   . History of rectal bleeding   . Hypertension   . Necrotizing soft tissue infection   . Obesity   . Obesity   . Obstructive sleep apnea   . Tobacco abuse 04/13/2014    Past Surgical  History:  Procedure Laterality Date  . ANKLE SURGERY Left   . APPLICATION OF WOUND VAC Left 09/24/2016   Procedure: APPLICATION OF WOUND VAC;  Surgeon: Ancil Linsey, MD;  Location: [facility] ORS;  Service: Vascular;  Laterality: Left;  Left lower extremity  . CARDIAC CATHETERIZATION  2009   s/p right coronary stent   . INCISION AND DRAINAGE ABSCESS Left 09/22/2016   Procedure: INCISION AND DRAINAGE ABSCESS and application of wound vac;  Surgeon: Leafy Ro, MD;  Location: [facility] ORS;  Service: General;  Laterality: Left;  . WOUND DEBRIDEMENT Left 09/24/2016   Procedure: DEBRIDEMENT WOUND;  Surgeon: Ancil Linsey, MD;  Location: [facility] ORS;  Service: Vascular;  Laterality: Left;  Left lower extremity     Current Outpatient Prescriptions  Medication Sig Dispense Refill  . aspirin 325 MG tablet Take 325 mg by mouth daily.      . citalopram (CELEXA) 40 MG tablet Take 40 mg by mouth daily.    Marland Kitchen losartan (COZAAR) 25 MG tablet Take 25 mg by mouth daily.    . metoprolol tartrate (LOPRESSOR) 50 MG tablet Take 50 mg by mouth 2 (two) times daily.    . nitroGLYCERIN (NITROSTAT) 0.4 MG SL tablet Place 0.4 mg under the tongue every 5 (five) minutes as needed.      . pravastatin (PRAVACHOL) 20 MG tablet Take 20 mg by mouth daily.     No current facility-administered medications for this visit.     Allergies:   Patient has no known allergies.  Social History:  The patient  reports that he has been smoking Cigarettes.  He has a 35.00 pack-year smoking history. He has never used smokeless tobacco. He reports that he drinks alcohol. He reports that he does not use drugs.   Family History:  The patient's family history includes Atrial fibrillation in his mother and unknown relative; Diabetes in his father; Other in his daughter.    ROS:  Please see the history of present illness.   Otherwise, review of systems are positive for none.   All other systems are reviewed and negative.    PHYSICAL  EXAM: VS:  BP (!) 159/113   Pulse 87   Ht 5\' 11"  (1.803 m)   Wt 278 lb (126.1 kg)   BMI 38.77 kg/m  , BMI Body mass index is 38.77 kg/m. GEN: Well nourished, well developed, in no acute distress  HEENT: normal  Neck: no JVD, carotid bruits, or masses Cardiac: Irregularly irregular; no murmurs, rubs, or gallops,no edema  Respiratory:  clear to auscultation bilaterally, normal work of breathing GI: soft, nontender, nondistended, + BS MS: no deformity or atrophy  Skin: warm and dry, no rash Neuro:  Strength and sensation are intact Psych: euthymic mood, full affect   EKG:  EKG is not ordered today.    Recent Labs: 09/23/2016: ALT 23; Magnesium 2.2; TSH 0.543 09/24/2016: Hemoglobin 14.2; Platelets 204 09/25/2016: BUN 17; Creatinine, Ser 0.75; Potassium 4.0; Sodium 138    Lipid Panel    Component Value Date/Time   CHOL 240 (H) 05/23/2008 2131   TRIG 163 (H) 05/23/2008 2131   HDL 37 (L) 05/23/2008 2131   CHOLHDL 6.5 Ratio 05/23/2008 2131   VLDL 33 05/23/2008 2131   LDLCALC 170 (H) 05/23/2008 2131      Wt Readings from Last 3 Encounters:  10/22/16 278 lb (126.1 kg)  10/16/16 281 lb (127.5 kg)  10/02/16 272 lb 9.6 oz (123.7 kg)       No flowsheet data found.    ASSESSMENT AND PLAN:  1.   Atrial fibrillation: Unknown duration. The patient appears to be asymptomatic overall. He resumed taking metoprolo on his own given that his blood pressure was elevated. His ventricular rate is controlled.CHADS VASc score is 2. Thus, I recommend long-term anticoagulation. I discussed different options with him. He does not want to go on warfarin given bad experience when his mother was taking this medication for atrial fibrillation. I discussed with him the options of NOACs but he is not able to afford them. He is trying to obtain health insurance at the present time. Continue aspirin for now with plans to switch him to Eliquis or Xarelto in the near future.We can consider cardioversion  once he is effectively anticoagulated for at least 3 weeks.  2. Essential hypertension: Blood pressure has not been controlled. I increased losartan to 100 mg once daily.  3. Coronary artery disease involving native coronary arteries without angina: Continue medical therapy.  4. Hyperlipidemia: Currently on pravastatin. He will require follow-up lipid and liver profile.     Disposition:   FU with me in [place] in 3 months.  Signed,  Lorine BearsMuhammad Julieann Drummonds, MD  10/22/2016 8:30 AM    [facility]

## 2016-10-22 NOTE — Patient Instructions (Signed)
Medication Instructions: INCREASE your Losartan to 100 mg tablet daily.  If you need a refill on your cardiac medications before your next appointment, please call your pharmacy.   Follow-Up: Your physician wants you to follow-up in: 3 months with Dr. Kirke CorinArida at the [facility]. You will receive a reminder letter in the mail two months in advance. If you don't receive a letter, please call our office to schedule this follow-up appointment.    Thank you for choosing [facility] at [facility]!!

## 2016-10-31 ENCOUNTER — Encounter: Payer: Self-pay | Admitting: Surgery

## 2016-10-31 ENCOUNTER — Ambulatory Visit (INDEPENDENT_AMBULATORY_CARE_PROVIDER_SITE_OTHER): Payer: Self-pay | Admitting: Surgery

## 2016-10-31 VITALS — BP 146/93 | HR 86 | Temp 97.6°F | Ht 71.0 in | Wt 265.5 lb

## 2016-10-31 DIAGNOSIS — Z09 Encounter for follow-up examination after completed treatment for conditions other than malignant neoplasm: Secondary | ICD-10-CM

## 2016-10-31 NOTE — Progress Notes (Signed)
S/p Left leg Debridement for necrotizing soft tissue infection. He is doing very well and he is doing wound VAC changes 3 times a week. He continues to smoke  PE morbidly obese in NAD Ext: wound vac in place 11 cm in length, minimal, continues to improve, no surrounding erythema, no evidence of necrotizing infection  A/p Doing well Continue wound vac Encourage smoking cessation and weight loss RTC 3 weeks

## 2016-10-31 NOTE — Patient Instructions (Signed)
We will see you back as scheduled below:  We strongly encourage you to quit smoking to help with the wound healing process.   Please call our office with any questions or concerns you may have.

## 2016-11-18 ENCOUNTER — Telehealth: Payer: Self-pay | Admitting: General Practice

## 2016-11-18 NOTE — Telephone Encounter (Signed)
Jeffrey Alvarado with [facility] calling regarding patient, Jeffrey Alvarado said the patients wound is getting better than it was, said they may be able to discharge the patient next week and Is asking if the patient could be seen sometime this week. Please call Jeffrey Alvarado back at [phone].

## 2016-11-19 ENCOUNTER — Ambulatory Visit: Payer: Self-pay | Admitting: Cardiovascular Disease

## 2016-11-19 NOTE — Telephone Encounter (Signed)
Noted  

## 2016-11-20 ENCOUNTER — Telehealth: Payer: Self-pay | Admitting: Surgery

## 2016-11-20 NOTE — Telephone Encounter (Signed)
Wound has improved dramacticaly. Would like to see if he can go from wound vac to wet to dry.

## 2016-11-21 ENCOUNTER — Telehealth: Payer: Self-pay

## 2016-11-21 ENCOUNTER — Ambulatory Visit (INDEPENDENT_AMBULATORY_CARE_PROVIDER_SITE_OTHER): Payer: Self-pay | Admitting: Surgery

## 2016-11-21 ENCOUNTER — Encounter: Payer: Self-pay | Admitting: Surgery

## 2016-11-21 VITALS — BP 152/96 | HR 78 | Temp 97.9°F | Ht 71.0 in | Wt 258.0 lb

## 2016-11-21 DIAGNOSIS — L03116 Cellulitis of left lower limb: Secondary | ICD-10-CM

## 2016-11-21 DIAGNOSIS — M7989 Other specified soft tissue disorders: Secondary | ICD-10-CM

## 2016-11-21 NOTE — Patient Instructions (Signed)
Wet-to-dry dressing changes Description Your health care provider has covered your wound with a wet-to-dry dressing. With this type of dressing, a wet (or moist) gauze dressing is put on your wound and allowed to dry. Wound drainage and dead tissue can be removed when you take off the old dressing.  Follow any instructions you are given on how to change the dressing. Use this sheet as a reminder.  Alternative Names Dressing changes; Wound care - dressing change  What to Expect at Home Your provider will tell you how often you should change your dressing at home.  As the wound heals, you should not need as much gauze or packing gauze.  Removing the Old Dressing Follow these steps to remove your dressing:  Wash your hands thoroughly with soap and warm water before and after each dressing change.  Put on a pair of non-sterile gloves.  Carefully remove the tape.  Remove the old dressing. If it is sticking to your skin, wet it with warm water to loosen it.  Remove the gauze pads or packing tape from inside your wound.  Put the old dressing, packing material, and your gloves in a plastic bag. Set the bag aside.  Cleaning Your Wound Follow these steps to clean your wound:  Put on a new pair of non-sterile gloves.  Use a clean, soft washcloth to gently clean your wound with warm water and soap. Your wound should not bleed much when you are cleaning it. A small amount of blood is OK.  Rinse your wound with water. Gently pat it dry with a clean towel. DO NOT rub it dry. In some cases, you can even rinse the wound while showering.  Check the wound for increased redness, swelling, or a bad odor.  Pay attention to the color and amount of drainage from your wound. Look for drainage that has become darker or thicker.  After cleaning your wound, remove your gloves and put them in the plastic bag with the old dressing and gloves.  Wash your hands again.  Changing Your Dressing Follow these steps to  put a new dressing on:  Put on a new pair of non-sterile gloves.  Pour saline into a clean bowl. Place gauze pads and any packing tape you will use in the bowl.  Squeeze the saline from the gauze pads or packing tape until it is no longer dripping.  Place the gauze pads or packing tape in your wound. Carefully fill in the wound and any spaces under the skin.  Cover the wet gauze or packing tape with a large dry dressing pad. Use tape or rolled gauze to hold this dressing in place.  Put all used supplies in the plastic bag. Close it securely, then put it in a second plastic bag, and close that bag securely. Put it in the trash.  Wash your hands again when you are finished.  When to Call the Doctor Call your doctor if you have any of these changes around your wound:  Worsening redness  More pain  Swelling  Bleeding  It is larger or deeper  It looks dried out or dark  The drainage is increasing  The drainage has a bad smell  Also call your doctor if:  Your temperature is 100.67F (38C), or higher, for more than 4 hours  Drainage is coming from or around the wound  Drainage is not decreasing after 3 to 5 days  Drainage is increasing  Drainage becomes thick, tan, yellow, or smells  bad

## 2016-11-21 NOTE — Progress Notes (Signed)
Surgical Clinic Progress/Follow-up Note   HPI:  56 y.o. Male presents to clinic for follow-up evaluation of Left leg wound 2 months s/p debridement for necrotizing soft tissue infection and 6 weeks since he completed prescribed course of oral antibiotics. He says he was told by his home health RN that his wound looks better, "like steak". Patient reports he continues to smoke unchanged, but had his metoprolol, losartan, and albuterol inhalers refilled by his new clinic PMD. His B/L lower extremity edema and the productive cough and URI symptoms over the past couple of days have also since resolved. He otherwise denies fever/chills, CP, or SOB.  Review of Systems:  Constitutional: denies any other weight loss, fever, chills, or sweats Eyes: denies any other vision changes, history of eye injury  ENT: denies sore throat, hearing problems  Respiratory: denies shortness of breath, wheezing  Cardiovascular: denies chest pain, palpitations  Gastrointestinal: denies abdominal pain, N/V, or diarrhea Musculoskeletal: denies any other joint pains or cramps  Skin: Denies any other rashes or skin discolorations  Neurological: denies any other headache, dizziness, weakness  Psychiatric: denies any other depression, anxiety  All other review of systems: otherwise negative   Vital Signs:  BP (!) 152/96   Pulse 78   Temp 97.9 F (36.6 C) (Oral)   Ht  (1.803 m)   Wt 258 lb (117 kg)   BMI 35.98 kg/m    Physical Exam:  Constitutional:  -- Normal overweight body habitus  -- Awake, alert, and oriented x3  Eyes:  -- Pupils equally round and reactive to light  -- No scleral icterus  Ear, nose, throat:  -- No jugular venous distension  -- No nasal drainage, bleeding Pulmonary:  -- No crackles -- Equal breath sounds bilaterally -- Breathing non-labored at rest Cardiovascular:  -- S1, S2 present  -- No pericardial rubs  Gastrointestinal:  -- Soft, nontender, non-distended, no  guarding/rebound  -- No abdominal masses appreciated, pulsatile or otherwise  Musculoskeletal / Integumentary:  -- Wounds or skin discoloration: Left lateral leg wound appears to be much more shallow and healing well, smaller than previous exam, with pink-beefy red granulation tissue of wound base and minimal surrounding erythema with no appreciable necrosis, bleeding, or purulent drainage -- Extremities: B/L UE and LE FROM, hands and feet warm, no further edema  Neurologic:  -- Motor function: intact and symmetric  -- Sensation: intact and symmetric   Pulse/Dopper Exam:  (p=palpable; d=doppler signals; 0=none)                          Right               Left              Fem     p                      p              Pop     p                      p              DP       p                      p  Assessment:  56 y.o. yo Male with a problem list including.Marland KitchenMarland Kitchen  Patient Active Problem List   Diagnosis Date Noted  . Necrotizing fasciitis (HCC)   . Necrotizing soft tissue infection   . Cellulitis and abscess of left lower extremity 09/21/2016  . Anxiety 04/13/2014  . CAD (coronary artery disease) 04/13/2014  . Cellulitis of left lower leg 04/13/2014  . Essential hypertension 04/13/2014  . Tobacco abuse 04/13/2014    presents to clinic for post-op follow-up evaluation of Left leg wound 2 months s/p debridement for necrotizing soft tissue infection and 6 weeks since he completed his prescribed course of oral antibiotics.  Plan:   - patient expresses wish to change VAC to wet-to-dry without pump to carry  - wound appears to be healing well and wound care can be continued with daily moist-to-dry dressings  - return to clinic in 4 weeks for follow-up, instructed to call office if any questions or concerns  - will fax wound care instructions to home health  - smoking cessation strongly encouraged  All of the above recommendations were discussed with the patient, and all of patient's  questions were answered to his expressed satisfaction.  -- Scherrie Gerlach Earlene Plater, MD, RPVI [place]: [facility] - Partnering for exceptional care. Office: [phone]

## 2016-11-21 NOTE — Telephone Encounter (Signed)
Call made to Sherrie at this time. Advised her that we saw the patient today in office and have decided to discontinue his wound vac and start a wet to dry dressing. I asked if there was an order form that needed to faxed? She stated that a verbal order would be fine. She repeated the verbal order to me. I confirmed that order. I did advise her that we gave him written instructions for the wet to dry dressing change as well. She stated that they will come out once to check the wound and show him how to change the dressing and that patient would then be discharged from their care due to his charity coverage coming to an end. I verbalized understanding and thanked her for her time.

## 2016-11-22 NOTE — Addendum Note (Signed)
Addended by: Chrisandra Netters on: 11/22/2016 11:14 PM   Modules accepted: Level of Service

## 2016-12-11 ENCOUNTER — Encounter: Payer: Self-pay | Admitting: Surgery

## 2016-12-23 ENCOUNTER — Ambulatory Visit (INDEPENDENT_AMBULATORY_CARE_PROVIDER_SITE_OTHER): Payer: Self-pay | Admitting: Surgery

## 2016-12-23 ENCOUNTER — Encounter: Payer: Self-pay | Admitting: Surgery

## 2016-12-23 VITALS — BP 138/95 | HR 68 | Temp 97.8°F | Ht 71.0 in | Wt 247.6 lb

## 2016-12-23 DIAGNOSIS — M7989 Other specified soft tissue disorders: Secondary | ICD-10-CM

## 2016-12-23 DIAGNOSIS — L03116 Cellulitis of left lower limb: Secondary | ICD-10-CM

## 2016-12-23 DIAGNOSIS — L02416 Cutaneous abscess of left lower limb: Secondary | ICD-10-CM

## 2016-12-23 NOTE — Patient Instructions (Signed)
We have advised that you start to apply only dry dressing to the wound.  We advised that you contact our office if you see any bright redness, drainage, have fever/chills, nausea/vomiting, and/or if you are having any other symptoms that you are not sure about.  Keep up the good work with the diet!

## 2016-12-23 NOTE — Progress Notes (Signed)
Surgical Clinic Progress/Follow-up Note   HPI:  56 y.o.Malepresents to clinic for follow-up evaluation of Left leg wound nearly 3 months s/p debridement for necrotizing soft tissue infection and 10 weeks after he completed prescribed antibiotics. Patient states he's been changing his wound VAC daily as was previously instructed, and he says the wound appears to be healing well with minimal pain even during dressing changes. He otherwise denies fever/chills, CP, or SOB.  Review of Systems:  Constitutional: denies any other weight loss, fever, chills, or sweats  Eyes: denies any other vision changes, history of eye injury  ENT: denies sore throat, hearing problems  Respiratory: denies shortness of breath, wheezing  Cardiovascular: denies chest pain, palpitations  Gastrointestinal: denies abdominal pain, N/V, or diarrhea Musculoskeletal: denies any other joint pains or cramps  Skin: Denies any other rashes or skin discolorations except as per HPI Neurological: denies any other headache, dizziness, weakness  Psychiatric: denies any other depression, anxiety  All other review of systems: otherwise negative   Vital Signs:  BP (!) 138/95   Pulse 68   Temp 97.8 F (36.6 C) (Oral)   Ht 5\' 11"  (1.803 m)   Wt 247 lb 9.6 oz (112.3 kg)   BMI 34.53 kg/m    Physical Exam:  Constitutional:  -- Normal body habitus  -- Awake, alert, and oriented x3  Eyes:  -- Pupils equally round and reactive to light  -- No scleral icterus  Ear, nose, throat:  -- No jugular venous distension  -- No nasal drainage, bleeding Pulmonary:  -- No crackles -- Equal breath sounds bilaterally -- Breathing non-labored at rest Cardiovascular:  -- S1, S2 present  -- No pericardial rubs  Gastrointestinal:  -- Soft, nontender, non-distended, no guarding/rebound  -- No abdominal masses appreciated, pulsatile or otherwise  Musculoskeletal / Integumentary:  -- Wounds or skin discoloration: Flat Left lateral leg  wound with much-decreased pink-beefy red granulation tissue of wound base and no further surrounding erythema nor any appreciable necrosis, bleeding, or purulent drainage  -- Extremities: B/L UE and LE FROM, hands and feet warm, no further edema  Neurologic:  -- Motor function: intact and symmetric  -- Sensation: intact and symmetric   Pulse/Dopper Exam:  (p=palpable; d=doppler signals; 0=none)    Right   Left   Fem  p   p   Pop  p   p   DP  p   p   Assessment:  56 y.o. yo Male with a problem list including...  Patient Active Problem List   Diagnosis Date Noted  . Necrotizing fasciitis (HCC)   . Necrotizing soft tissue infection   . Cellulitis and abscess of left lower extremity 09/21/2016  . Anxiety 04/13/2014  . CAD (coronary artery disease) 04/13/2014  . Cellulitis of left lower leg 04/13/2014  . Essential hypertension 04/13/2014  . Tobacco abuse 04/13/2014    presents to clinic for follow-up evaluation of Left leg wound nearly 3 months s/p debridement for necrotizing soft tissue infection and 10 weeks after he completed prescribed antibiotics.  Plan:   - continue current daily clean dry gauze dressing changes   - patient expresses he wishes to follow-up further only as needed  - smoking cessation once more strongly encouraged and discussed for both wound healing and overall health  - return to clinic as needed, instructed to call office if any questions or concerns  All of the above recommendations were discussed with the patient, and all of patient's questions were answered to his  expressed satisfaction.  -- Marilynne Drivers Rosana Hoes, MD, [place]: [facility] - Partnering for exceptional care. Office: [phone]

## 2017-01-21 ENCOUNTER — Ambulatory Visit: Payer: Self-pay | Admitting: Cardiovascular Disease

## 2017-01-22 ENCOUNTER — Telehealth: Payer: Self-pay | Admitting: Cardiovascular Disease

## 2017-01-22 NOTE — Telephone Encounter (Signed)
lmov to r/s 01/21/17 Dr. Kirke CorinArida appt missed due to weather

## 2017-12-23 ENCOUNTER — Other Ambulatory Visit: Payer: Self-pay | Admitting: Cardiovascular Disease

## 2018-12-05 IMAGING — US US MISC SOFT TISSUE
1 series · 8 of 8 positions shown · non-contrast
Comparison: None.

CLINICAL DATA: Initial evaluation for soft tissue swelling, abscess
of left leg.

EXAM:
ULTRASOUND left LOWER EXTREMITY LIMITED
TECHNIQUE: Ultrasound examination of the lower extremity soft tissues was
performed in the area of clinical concern.

[Series 1: us misc soft tissue · 0.07mm/px · 8 of 8 slices shown]
[im 1/8]
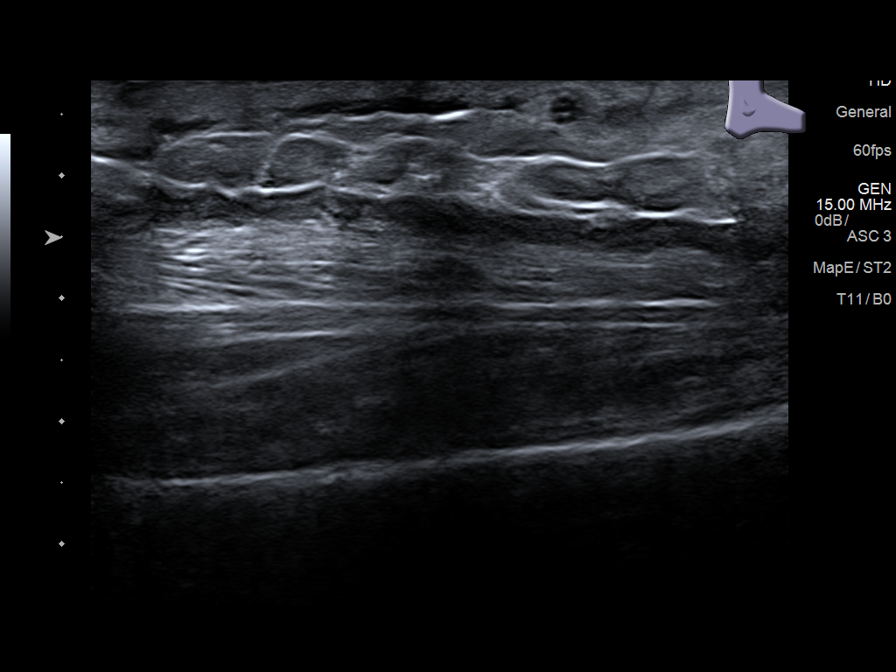
[im 2/8]
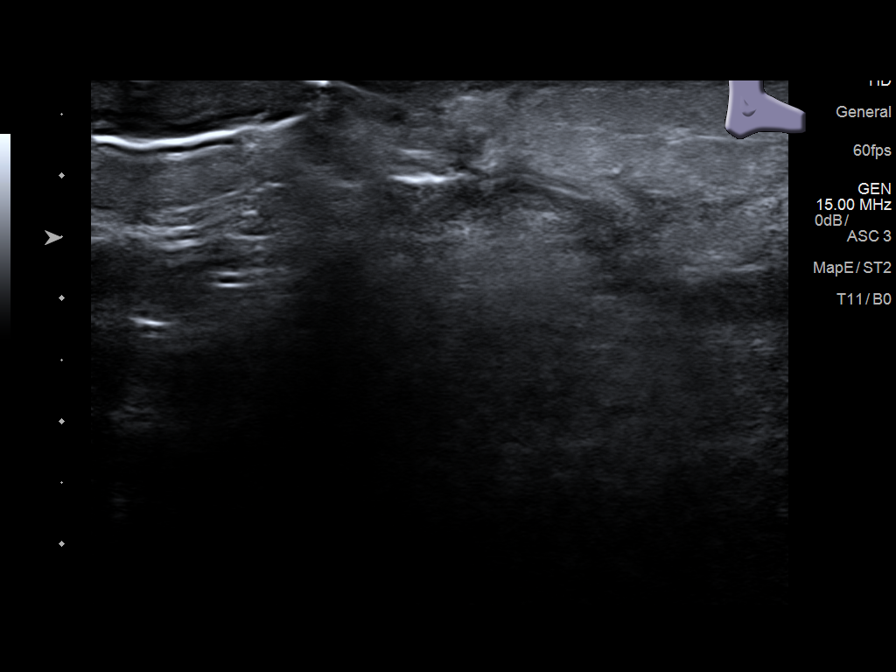
[im 3/8]
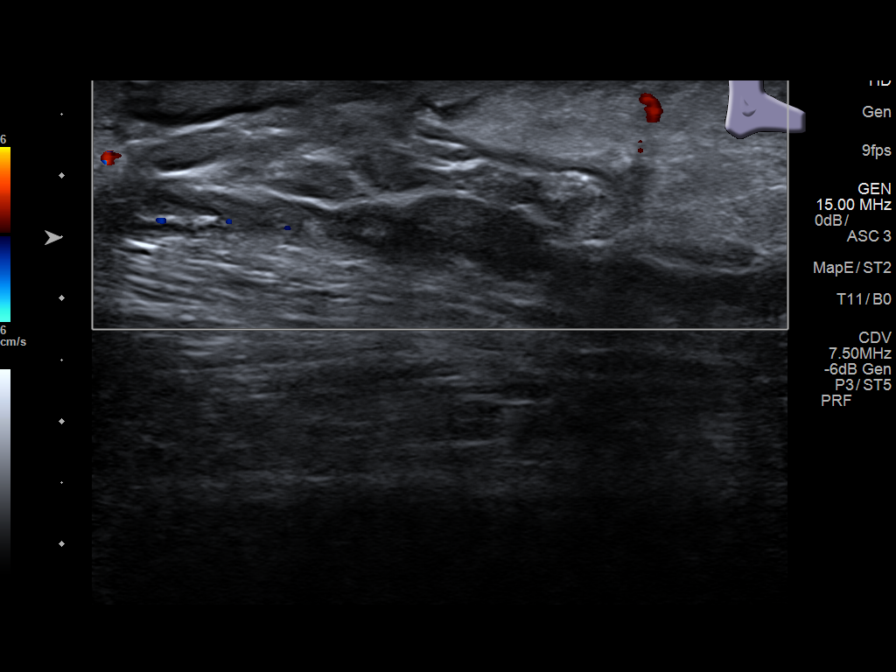
[im 4/8]
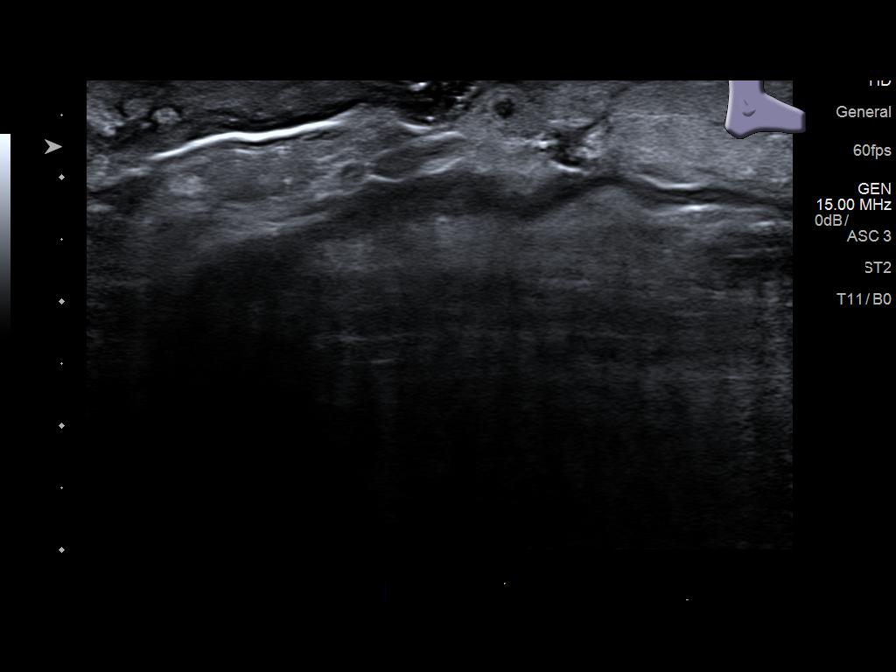
[im 5/8]
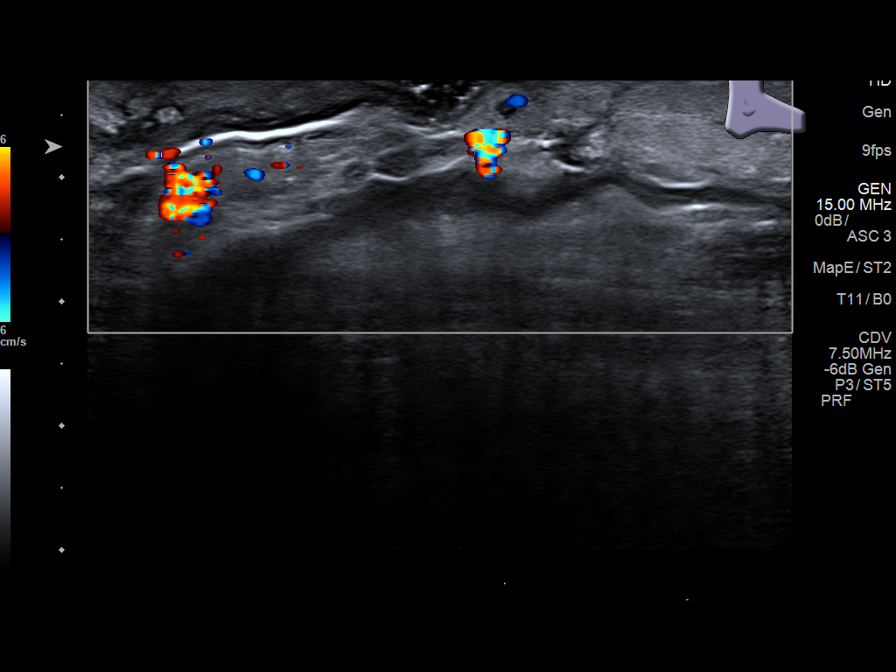
[im 6/8]
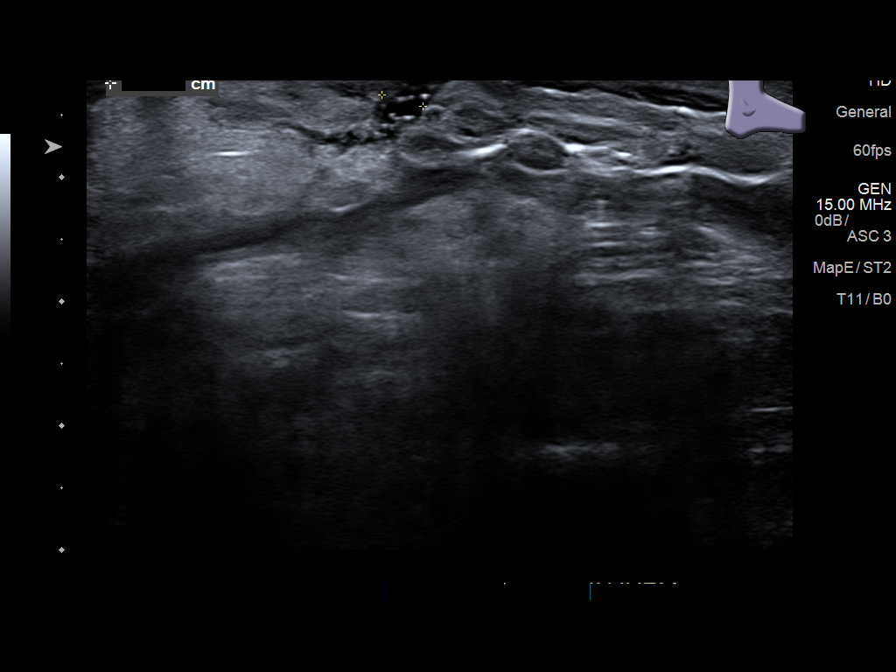
[im 7/8]
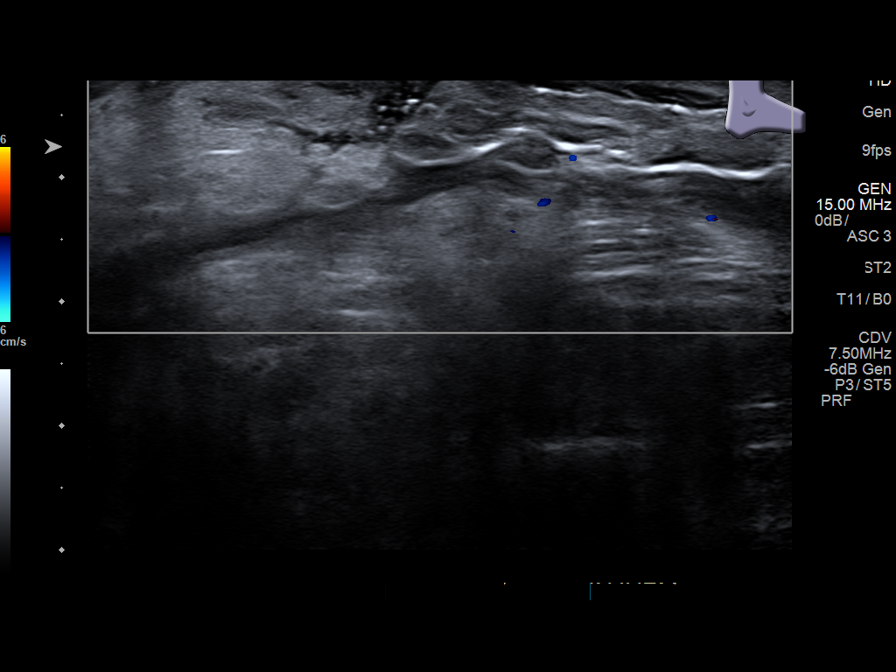
[im 8/8]
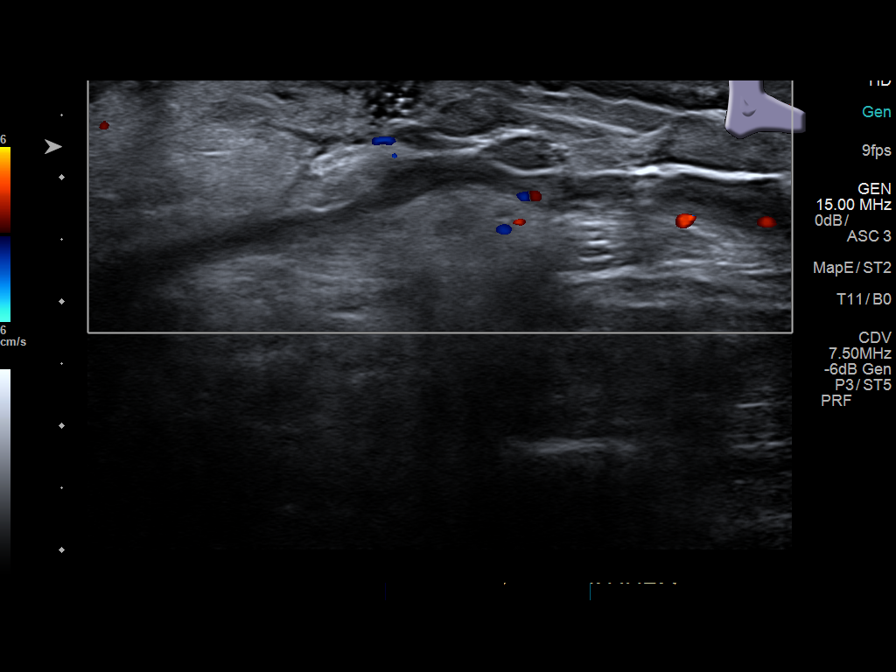

[8 of 8 positions shown; findings below may reference images not displayed]

FINDINGS: Targeted ultrasound of the left lower extremity was performed at
site of swelling and draining wound. Targeted ultrasound of the area
of concern demonstrates diffuse soft tissue swelling and edema.
Overlying skin defect measuring approximately 3 mm in size,
consistent with draining tract. No discrete abscess or other
drainable fluid collection identified.
IMPRESSION: Diffuse soft tissue swelling and edema with associated draining
track at area of concern in the left lower extremity, concerning for
infection/cellulitis. No other discrete abscess or drainable fluid
collection identified.
